# Patient Record
Sex: Male | Born: 1990 | Race: Black or African American | Hispanic: No | Marital: Single | Smoking: Current every day smoker
Health system: Southern US, Community
[De-identification: ages and names within clinical notes are randomized; demographics above are authoritative.]

## PROBLEM LIST (undated history)

## (undated) DIAGNOSIS — N200 Calculus of kidney: Secondary | ICD-10-CM

## (undated) DIAGNOSIS — T148XXA Other injury of unspecified body region, initial encounter: Secondary | ICD-10-CM

## (undated) DIAGNOSIS — J45909 Unspecified asthma, uncomplicated: Secondary | ICD-10-CM

## (undated) HISTORY — PX: KIDNEY STONE SURGERY: SHX686

---

## 2008-06-06 ENCOUNTER — Emergency Department (HOSPITAL_BASED_OUTPATIENT_CLINIC_OR_DEPARTMENT_OTHER): Admission: EM | Admit: 2008-06-06 | Discharge: 2008-06-06 | Payer: Self-pay | Admitting: Emergency Medicine

## 2010-05-10 ENCOUNTER — Emergency Department (HOSPITAL_BASED_OUTPATIENT_CLINIC_OR_DEPARTMENT_OTHER): Admission: EM | Admit: 2010-05-10 | Discharge: 2010-05-10 | Payer: Self-pay | Admitting: Emergency Medicine

## 2010-05-10 ENCOUNTER — Ambulatory Visit: Payer: Self-pay | Admitting: Diagnostic Radiology

## 2010-05-11 ENCOUNTER — Emergency Department (HOSPITAL_BASED_OUTPATIENT_CLINIC_OR_DEPARTMENT_OTHER): Admission: EM | Admit: 2010-05-11 | Discharge: 2010-05-11 | Payer: Self-pay | Admitting: Emergency Medicine

## 2010-09-20 ENCOUNTER — Emergency Department (HOSPITAL_BASED_OUTPATIENT_CLINIC_OR_DEPARTMENT_OTHER): Admission: EM | Admit: 2010-09-20 | Discharge: 2010-09-20 | Payer: Self-pay | Admitting: Emergency Medicine

## 2010-12-27 HISTORY — PX: URETERAL STENT PLACEMENT: SHX822

## 2011-03-15 LAB — URINALYSIS, ROUTINE W REFLEX MICROSCOPIC
Bilirubin Urine: NEGATIVE
Glucose, UA: NEGATIVE mg/dL
Ketones, ur: 15 mg/dL — AB
pH: 6.5 (ref 5.0–8.0)

## 2011-03-15 LAB — URINE MICROSCOPIC-ADD ON

## 2011-03-15 LAB — CBC
HCT: 41.4 % (ref 39.0–52.0)
MCHC: 34.1 g/dL (ref 30.0–36.0)
MCV: 88.9 fL (ref 78.0–100.0)
MCV: 90.6 fL (ref 78.0–100.0)
RBC: 3.75 MIL/uL — ABNORMAL LOW (ref 4.22–5.81)
RBC: 4.65 MIL/uL (ref 4.22–5.81)

## 2011-03-15 LAB — BASIC METABOLIC PANEL
BUN: 16 mg/dL (ref 6–23)
CO2: 28 mEq/L (ref 19–32)
Calcium: 8.5 mg/dL (ref 8.4–10.5)
Calcium: 9.6 mg/dL (ref 8.4–10.5)
GFR calc Af Amer: >60 mL/min (ref >60)
GFR calc Af Amer: >60 mL/min (ref >60)
Glucose, Bld: 98 mg/dL (ref 70–99)
Potassium: 3.5 mEq/L (ref 3.5–5.1)
Potassium: 4.3 mEq/L (ref 3.5–5.1)
Sodium: 144 mEq/L (ref 135–145)
Sodium: 146 mEq/L — ABNORMAL HIGH (ref 135–145)

## 2011-03-15 LAB — DIFFERENTIAL
Basophils Absolute: 0 10*3/uL (ref 0.0–0.1)
Basophils Relative: 0 % (ref 0–1)
Basophils Relative: 1 % (ref 0–1)
Eosinophils Absolute: 0 10*3/uL (ref 0.0–0.7)
Eosinophils Relative: 0 % (ref 0–5)
Lymphs Abs: 1.7 10*3/uL (ref 0.7–4.0)
Monocytes Relative: 5 % (ref 3–12)
Neutro Abs: 8 10*3/uL — ABNORMAL HIGH (ref 1.7–7.7)
Neutrophils Relative %: 84 % — ABNORMAL HIGH (ref 43–77)

## 2011-05-26 ENCOUNTER — Emergency Department (HOSPITAL_BASED_OUTPATIENT_CLINIC_OR_DEPARTMENT_OTHER)
Admission: EM | Admit: 2011-05-26 | Discharge: 2011-05-26 | Disposition: A | Discharge status: Home / Self Care | Payer: No Typology Code available for payment source | Attending: Emergency Medicine | Admitting: Emergency Medicine

## 2011-05-26 DIAGNOSIS — J45909 Unspecified asthma, uncomplicated: Secondary | ICD-10-CM | POA: Insufficient documentation

## 2011-05-26 DIAGNOSIS — Z043 Encounter for examination and observation following other accident: Secondary | ICD-10-CM | POA: Insufficient documentation

## 2011-12-29 ENCOUNTER — Encounter: Payer: Self-pay | Admitting: *Deleted

## 2011-12-29 ENCOUNTER — Emergency Department (HOSPITAL_BASED_OUTPATIENT_CLINIC_OR_DEPARTMENT_OTHER): Payer: Self-pay

## 2011-12-29 ENCOUNTER — Emergency Department (INDEPENDENT_AMBULATORY_CARE_PROVIDER_SITE_OTHER): Payer: Self-pay

## 2011-12-29 ENCOUNTER — Emergency Department (HOSPITAL_BASED_OUTPATIENT_CLINIC_OR_DEPARTMENT_OTHER)
Admission: EM | Admit: 2011-12-29 | Discharge: 2011-12-29 | Disposition: A | Discharge status: Home / Self Care | Payer: Self-pay | Attending: Emergency Medicine | Admitting: Emergency Medicine

## 2011-12-29 DIAGNOSIS — K5900 Constipation, unspecified: Secondary | ICD-10-CM

## 2011-12-29 DIAGNOSIS — F17200 Nicotine dependence, unspecified, uncomplicated: Secondary | ICD-10-CM | POA: Insufficient documentation

## 2011-12-29 DIAGNOSIS — N200 Calculus of kidney: Secondary | ICD-10-CM

## 2011-12-29 DIAGNOSIS — K089 Disorder of teeth and supporting structures, unspecified: Secondary | ICD-10-CM | POA: Insufficient documentation

## 2011-12-29 DIAGNOSIS — R1110 Vomiting, unspecified: Secondary | ICD-10-CM | POA: Insufficient documentation

## 2011-12-29 DIAGNOSIS — R1031 Right lower quadrant pain: Secondary | ICD-10-CM

## 2011-12-29 HISTORY — DX: Calculus of kidney: N20.0

## 2011-12-29 LAB — CBC
Platelets: 170 10*3/uL (ref 150–400)
RBC: 4.52 MIL/uL (ref 4.22–5.81)
RDW: 12.1 % (ref 11.5–15.5)

## 2011-12-29 LAB — URINALYSIS, ROUTINE W REFLEX MICROSCOPIC
Glucose, UA: NEGATIVE mg/dL
Ketones, ur: 15 mg/dL — AB
Nitrite: NEGATIVE
Specific Gravity, Urine: 1.029 (ref 1.005–1.030)
Urobilinogen, UA: 1 mg/dL (ref 0.0–1.0)
pH: 6 (ref 5.0–8.0)

## 2011-12-29 LAB — URINE MICROSCOPIC-ADD ON

## 2011-12-29 LAB — COMPREHENSIVE METABOLIC PANEL
AST: 22 U/L (ref 0–37)
Calcium: 9.3 mg/dL (ref 8.4–10.5)
Chloride: 98 mEq/L (ref 96–112)
Creatinine, Ser: 1.1 mg/dL (ref 0.50–1.35)
GFR calc non Af Amer: >90 mL/min (ref >90)
Potassium: 3.7 mEq/L (ref 3.5–5.1)
Total Bilirubin: 0.6 mg/dL (ref 0.3–1.2)
Total Protein: 7.8 g/dL (ref 6.0–8.3)

## 2011-12-29 LAB — DIFFERENTIAL
Basophils Absolute: 0 10*3/uL (ref 0.0–0.1)
Basophils Relative: 0 % (ref 0–1)
Eosinophils Absolute: 0 10*3/uL (ref 0.0–0.7)
Eosinophils Relative: 0 % (ref 0–5)
Lymphocytes Relative: 19 % (ref 12–46)
Lymphs Abs: 1.8 10*3/uL (ref 0.7–4.0)
Monocytes Relative: 13 % — ABNORMAL HIGH (ref 3–12)

## 2011-12-29 MED ORDER — IOHEXOL 300 MG/ML  SOLN
100.0000 mL | Freq: Once | INTRAMUSCULAR | Status: AC | PRN
  Administered 2011-12-29: 100 mL via INTRAVENOUS

## 2011-12-29 MED ORDER — HYDROMORPHONE HCL PF 1 MG/ML IJ SOLN
1.0000 mg | Freq: Once | INTRAMUSCULAR | Status: AC
  Administered 2011-12-29: 1 mg via INTRAVENOUS
  Filled 2011-12-29: qty 1

## 2011-12-29 MED ORDER — ONDANSETRON HCL 4 MG/2ML IJ SOLN
4.0000 mg | Freq: Once | INTRAMUSCULAR | Status: AC
  Administered 2011-12-29: 4 mg via INTRAVENOUS
  Filled 2011-12-29: qty 2

## 2011-12-29 MED ORDER — METOCLOPRAMIDE HCL 10 MG PO TABS: 10.0000 mg | ORAL_TABLET | Freq: Four times a day (QID) | ORAL | Status: AC | PRN

## 2011-12-29 MED ORDER — SODIUM CHLORIDE 0.9 % IV BOLUS (SEPSIS)
1000.0000 mL | Freq: Once | INTRAVENOUS | Status: AC
  Administered 2011-12-29: 1000 mL via INTRAVENOUS

## 2011-12-29 MED ORDER — SODIUM CHLORIDE 0.9 % IV SOLN
Freq: Once | INTRAVENOUS | Status: AC
  Administered 2011-12-29: 20:00:00 via INTRAVENOUS

## 2011-12-29 MED ORDER — IOHEXOL 300 MG/ML  SOLN
18.0000 mL | INTRAMUSCULAR | Status: DC | PRN
  Administered 2011-12-29: 18 mL via ORAL

## 2011-12-29 MED ORDER — OXYCODONE-ACETAMINOPHEN 5-325 MG PO TABS: 1.0000 | ORAL_TABLET | ORAL | Status: AC | PRN

## 2011-12-29 NOTE — ED Notes (Signed)
Pt ambulatory to restroom

## 2011-12-29 NOTE — ED Provider Notes (Signed)
History     CSN: [id number]  Arrival date & time 12/29/11  1629   First MD Initiated Contact with Patient 12/29/11 1722      Chief Complaint  Patient presents with  . Flank Pain  . Emesis  . Dental Pain    (Consider location/radiation/quality/duration/timing/severity/associated sxs/prior treatment) Patient is a 21 y.o. male presenting with flank pain, vomiting, and tooth pain. The history is provided by the patient.  Flank Pain  Emesis   Dental Pain He complains of a two-day history of fever to 102, chills, right flank pain, and cough along with generalized myalgias. He also broke a right lower second molar tooth and has pain in that tooth radiating up to his jaw and toward his ear. There has been associated nausea, vomiting. He denies diarrhea and denies and dysuria. He does have a history of kidney stones and states that the flank pain is similar to his kidney stone pain. Pain is severe and he rates it at 7 out of 10. He has not taken anything for pain or for fever. There has been some dizziness. He denies any sick contacts.  Past Medical History  Diagnosis Date  . Kidney stones     History reviewed. No pertinent past surgical history.  History reviewed. No pertinent family history.  History  Substance Use Topics  . Smoking status: Current Some Day Smoker  . Smokeless tobacco: Never Used  . Alcohol Use: No      Review of Systems  Gastrointestinal: Positive for vomiting.  Genitourinary: Positive for flank pain.  All other systems reviewed and are negative.    Allergies  Review of patient's allergies indicates no known allergies.  Home Medications   Current Outpatient Rx  Name Route Sig Dispense Refill  . PSEUDOEPH-DOXYLAMINE-DM-APAP 60-7.05-25-999 MG/30ML PO LIQD Oral Take 30 mLs by mouth every 6 (six) hours as needed. For congestion     . ALBUTEROL SULFATE HFA 108 (90 BASE) MCG/ACT IN AERS Inhalation Inhale 2 puffs into the lungs every 6 (six) hours as  needed. For shortness of breath and wheezing     . FLUTICASONE-SALMETEROL 250-50 MCG/DOSE IN AEPB Inhalation Inhale 1 puff into the lungs every 12 (twelve) hours.        BP 107/61  Pulse 96  Temp(Src) 97.8 F (36.6 C) (Oral)  Resp 20  SpO2 100%  Physical Exam  Nursing note and vitals reviewed.  21 year old male who appears uncomfortable. Vital signs are normal. Oxygen saturation is 100% which is normal. Head is normocephalic and atraumatic. PERRLA, EOMI. Mucous membranes are slightly dry. Tooth #31 is carious with tenderness in that region. Neck is supple without adenopathy or or JVD. Lungs are clear without any rales, wheezes, rhonchi. Back is nontender there's no CVA tenderness. Heart has regular rate rhythm without murmur. Abdomen is soft, flat, with moderate to severe tenderness in the right lower quadrant in McBurney's area. There is no rebound or guarding, but there is tenderness with percussion and with coughing. Peristalsis is diminished. Extremities have no cyanosis or edema, full range of motion present. Skin is warm and dry without rash. Neurologic: Mental status is normal, cranial nerves are intact, there no focal motor or sensory deficits. Psychiatric: No abnormalities of mood or affect.  ED Course  Procedures (including critical care time)  Labs Reviewed  URINALYSIS, ROUTINE W REFLEX MICROSCOPIC - Abnormal; Notable for the following:    Color, Urine AMBER (*) BIOCHEMICALS MAY BE AFFECTED BY COLOR   Hgb urine dipstick TRACE (*)  Ketones, ur 15 (*)    Leukocytes, UA SMALL (*)    All other components within normal limits  URINE MICROSCOPIC-ADD ON   No results found.  Results for orders placed during the hospital encounter of 12/29/11  URINALYSIS, ROUTINE W REFLEX MICROSCOPIC      Component Value Range   Color, Urine AMBER (*) YELLOW    APPearance CLEAR  CLEAR    Specific Gravity, Urine 1.029  1.005 - 1.030    pH 6.0  5.0 - 8.0    Glucose, UA NEGATIVE  NEGATIVE (mg/dL)    Hgb urine dipstick TRACE (*) NEGATIVE    Bilirubin Urine NEGATIVE  NEGATIVE    Ketones, ur 15 (*) NEGATIVE (mg/dL)   Protein, ur NEGATIVE  NEGATIVE (mg/dL)   Urobilinogen, UA 1.0  0.0 - 1.0 (mg/dL)   Nitrite NEGATIVE  NEGATIVE    Leukocytes, UA SMALL (*) NEGATIVE   URINE MICROSCOPIC-ADD ON      Component Value Range   Squamous Epithelial / LPF RARE  RARE    WBC, UA 3-6  <3 (WBC/hpf)   RBC / HPF 0-2  <3 (RBC/hpf)   Bacteria, UA RARE  RARE    Urine-Other MUCOUS PRESENT    CBC      Component Value Range   WBC 9.4  4.0 - 10.5 (K/uL)   RBC 4.52  4.22 - 5.81 (MIL/uL)   Hemoglobin 13.3  13.0 - 17.0 (g/dL)   HCT 21.3 (*) 08.6 - 52.0 (%)   MCV 85.8  78.0 - 100.0 (fL)   MCH 29.4  26.0 - 34.0 (pg)   MCHC 34.3  30.0 - 36.0 (g/dL)   RDW 57.8  46.9 - 62.9 (%)   Platelets 170  150 - 400 (K/uL)  DIFFERENTIAL      Component Value Range   Neutrophils Relative 68  43 - 77 (%)   Neutro Abs 6.5  1.7 - 7.7 (K/uL)   Lymphocytes Relative 19  12 - 46 (%)   Lymphs Abs 1.8  0.7 - 4.0 (K/uL)   Monocytes Relative 13 (*) 3 - 12 (%)   Monocytes Absolute 1.2 (*) 0.1 - 1.0 (K/uL)   Eosinophils Relative 0  0 - 5 (%)   Eosinophils Absolute 0.0  0.0 - 0.7 (K/uL)   Basophils Relative 0  0 - 1 (%)   Basophils Absolute 0.0  0.0 - 0.1 (K/uL)  COMPREHENSIVE METABOLIC PANEL      Component Value Range   Sodium 136  135 - 145 (mEq/L)   Potassium 3.7  3.5 - 5.1 (mEq/L)   Chloride 98  96 - 112 (mEq/L)   CO2 27  19 - 32 (mEq/L)   Glucose, Bld 96  70 - 99 (mg/dL)   BUN 14  6 - 23 (mg/dL)   Creatinine, Ser 5.28  0.50 - 1.35 (mg/dL)   Calcium 9.3  8.4 - 41.3 (mg/dL)   Total Protein 7.8  6.0 - 8.3 (g/dL)   Albumin 4.5  3.5 - 5.2 (g/dL)   AST 22  0 - 37 (U/L)   ALT 14  0 - 53 (U/L)   Alkaline Phosphatase 68  39 - 117 (U/L)   Total Bilirubin 0.6  0.3 - 1.2 (mg/dL)   GFR calc non Af Amer >90  >90 (mL/min)   GFR calc Af Amer >90  >90 (mL/min)  LIPASE, BLOOD      Component Value Range   Lipase 45  11 - 59 (U/L)     Ct  Abdomen Pelvis W Contrast  12/29/2011  **ADDENDUM** CREATED: 12/29/2011 20:55:31  2 hours after the initial examination, the patient was returned for delayed imaging through the pelvis.  No additional contrast was administered.  The primary restraint of the examination remains (limited intra-abdominal fat).  The small bowel and right colon are now well filled with contrast.  There is a tubular air-filled structure along the right pelvic sidewall (axial images 22 - 27 and coronal image 37) which is most consistent with a normal appendix. No inflammatory changes are identified. Contrast material is present within the ureters bilaterally.  There is no evidence of ureteral obstruction.  In summary, delayed images through the pelvis do not show any evidence of appendicitis.  **END ADDENDUM** SIGNED BY: Gerrianne Scale, M.D.    12/29/2011  *RADIOLOGY REPORT*  Clinical Data: Right lower quadrant abdominal pain with constipation and vomiting.  Evaluate for appendicitis or kidney stones.  History of asthma and kidney stones.  CT ABDOMEN AND PELVIS WITH CONTRAST  Technique:  Multidetector CT imaging of the abdomen and pelvis was performed following the standard protocol during bolus administration of intravenous contrast.  Contrast: OMNIPAQUE IOHEXOL 300 MG/ML IV SOLN  Comparison: CT urogram 05/10/2010.  Findings: The lung bases are clear.  There is no pleural effusion. Previously demonstrated right renal calculi are not as well seen on this postcontrast examination but appear grossly unchanged.  The left kidney appears normal.  There is no hydronephrosis.  Ureteral assessment is limited by the paucity of intra-abdominal fat.  I do not see any evidence of a ureteral or bladder calculus.  The liver, spleen, gallbladder, pancreas and adrenal glands appear unremarkable.  Enteric contrast has emptied from the stomach into the proximal to mid small bowel.  The colon is not yet opacified with contrast. The cecum is  located inferiorly in the right lower quadrant adjacent to the bladder.  The appendix is not identified with certainty.  However, there is an oval focus of prominent mucosal enhancement superior to the urinary bladder on image 72.  This could reflect distal small bowel or the appendix.  No definite extraluminal fluid collections or focal inflammatory changes are identified.  However, assessment for pelvic inflammation is limited by the lack of pelvic fat.  The bladder appears unremarkable.  No acute osseous findings are demonstrated.  IMPRESSION:  1.  Limited study due to non opacification of the terminal ileum and cecum and limited intra-abdominal fat.  The appendix is not seen with certainty. 2.  Right renal calculi appear grossly unchanged.  There is no evidence of ureteral calculus or hydronephrosis.  Close clinical follow-up is recommended due to the limitations of this examination.  Findings were discussed by telephone with Dr. Preston Fleeting at 1910 hours on 12/29/2011.  Original Report Authenticated By: Gerrianne Scale, M.D.     No diagnosis found.  He is given IV hydration, and Dilaudid with Zofran for pain. This is given disability for pain but is required re\re medication. I reviewed the CT scan and discussed with radiologist. He appendix is not visualized, but he has no body fat to show signs of inflammatory changes, and oral contrast had not reached the cecum. I discussed case with Dr. Derrell Lolling of [facility] surgery and we will repeat the CT scan after several hours to see if contrast has reached the cecum to allow definitive evaluation of the appendix. If not, patient will be sent to [facility] emergency department for a consultation with surgery.  2125: CT scan seems to show at this point a normal appendix, but it is still suboptimal because of a paucity of fat. Patient was reexamined, and he continues to have tenderness in the right lower quadrant, but it does not seem to be as severe  as it was initially. With normal WBC and differential indicating probable viral illness, I feel he most likely has mesenteric adenitis. He will be discharged, but is advised that he should return if symptoms worsen or just are not improving. It was impressed upon him and upon family that is with him that appendicitis can sometimes be difficult to diagnose initially.  MDM  Symptom complex which may all be influenza, but with well localized right lower quadrant pain, need to get a CT scan to rule out appendicitis. Of note, when he is asked to point to where his flank pain is, he actually points to the lateral aspect of the right lower quadrant and not to the back or flank. Dental caries and/or fracture will need to be treated by a dentist.        Dione Booze, MD 12/29/11 2125

## 2011-12-29 NOTE — ED Notes (Signed)
Pt transferred to ED room 9.  Visitors at bedside. Awaiting CT scan.

## 2011-12-29 NOTE — ED Notes (Signed)
Vomiting since new years eve dizzy flank pain and tooth ache pt drinking gatorade in triage

## 2012-06-27 ENCOUNTER — Emergency Department (HOSPITAL_BASED_OUTPATIENT_CLINIC_OR_DEPARTMENT_OTHER)
Admission: EM | Admit: 2012-06-27 | Discharge: 2012-06-27 | Disposition: A | Discharge status: Home / Self Care | Payer: Federal, State, Local not specified - PPO | Attending: Emergency Medicine | Admitting: Emergency Medicine

## 2012-06-27 ENCOUNTER — Encounter (HOSPITAL_BASED_OUTPATIENT_CLINIC_OR_DEPARTMENT_OTHER): Payer: Self-pay | Admitting: *Deleted

## 2012-06-27 DIAGNOSIS — T6391XA Toxic effect of contact with unspecified venomous animal, accidental (unintentional), initial encounter: Secondary | ICD-10-CM | POA: Insufficient documentation

## 2012-06-27 DIAGNOSIS — T63441A Toxic effect of venom of bees, accidental (unintentional), initial encounter: Secondary | ICD-10-CM

## 2012-06-27 DIAGNOSIS — T63461A Toxic effect of venom of wasps, accidental (unintentional), initial encounter: Secondary | ICD-10-CM | POA: Insufficient documentation

## 2012-06-27 HISTORY — DX: Unspecified asthma, uncomplicated: J45.909

## 2012-06-27 MED ORDER — DIPHENHYDRAMINE HCL 25 MG PO CAPS
25.0000 mg | ORAL_CAPSULE | Freq: Once | ORAL | Status: AC
  Administered 2012-06-27: 25 mg via ORAL
  Filled 2012-06-27: qty 1

## 2012-06-27 MED ORDER — PREDNISONE 20 MG PO TABS: ORAL_TABLET | ORAL | Status: AC

## 2012-06-27 MED ORDER — PREDNISONE 50 MG PO TABS
60.0000 mg | ORAL_TABLET | Freq: Once | ORAL | Status: AC
  Administered 2012-06-27: 60 mg via ORAL
  Filled 2012-06-27: qty 1

## 2012-06-27 MED ORDER — FAMOTIDINE 20 MG PO TABS
20.0000 mg | ORAL_TABLET | Freq: Once | ORAL | Status: AC
  Administered 2012-06-27: 20 mg via ORAL
  Filled 2012-06-27: qty 1

## 2012-06-27 NOTE — ED Notes (Signed)
Pt was stung by a yellow jacket to the right lower leg 2 days ago. Now experiencing pain and swelling that is worsening and extending into the foot.

## 2012-06-27 NOTE — ED Provider Notes (Signed)
History     CSN: [id number]  Arrival date & time 06/27/12  0036   First MD Initiated Contact with Patient 06/27/12 [phone]      Chief Complaint  Patient presents with  . Leg Swelling    (Consider location/radiation/quality/duration/timing/severity/associated sxs/prior treatment) Patient is a 21 y.o. male presenting with rash. The history is provided by the patient. No language interpreter was used.  Rash  This is a new problem. The current episode started 2 days ago. The problem has been gradually worsening. The problem is associated with an insect bite/sting (yellow jacket). There has been no fever. The rash is present on the right lower leg. The patient is experiencing no pain. Associated symptoms include itching. He has tried nothing for the symptoms. The treatment provided no relief. Risk factors: yellow jacket sting.    Past Medical History  Diagnosis Date  . Kidney stones   . Asthma     History reviewed. No pertinent past surgical history.  No family history on file.  History  Substance Use Topics  . Smoking status: Current Some Day Smoker  . Smokeless tobacco: Never Used  . Alcohol Use: No      Review of Systems  Skin: Positive for itching and rash. Negative for wound.  All other systems reviewed and are negative.    Allergies  Review of patient's allergies indicates no known allergies.  Home Medications   Current Outpatient Rx  Name Route Sig Dispense Refill  . ALBUTEROL SULFATE HFA 108 (90 BASE) MCG/ACT IN AERS Inhalation Inhale 2 puffs into the lungs every 6 (six) hours as needed. For shortness of breath and wheezing     . FLUTICASONE-SALMETEROL 250-50 MCG/DOSE IN AEPB Inhalation Inhale 1 puff into the lungs every 12 (twelve) hours.      Marland Kitchen PSEUDOEPH-DOXYLAMINE-DM-APAP 60-7.05-25-999 MG/30ML PO LIQD Oral Take 30 mLs by mouth every 6 (six) hours as needed. For congestion       BP 98/55  Pulse 65  Temp 98 F (36.7 C) (Oral)  Resp 18  Ht 5\' 11"  (1.803  m)  Wt 148 lb (67.132 kg)  BMI 20.64 kg/m2  SpO2 100%  Physical Exam  Constitutional: He is oriented to person, place, and time. He appears well-developed and well-nourished. No distress.  HENT:  Head: Normocephalic and atraumatic.  Mouth/Throat: Oropharynx is clear and moist.       No swelling of the lips tongue or uvula  Eyes: Conjunctivae are normal. Pupils are equal, round, and reactive to light.  Neck: Normal range of motion. Neck supple.  Cardiovascular: Normal rate and regular rhythm.   Pulmonary/Chest: Effort normal and breath sounds normal. No stridor. He has no wheezes. He has no rales.  Abdominal: Soft. Bowel sounds are normal. There is no tenderness. There is no rebound and no guarding.  Musculoskeletal: Normal range of motion.       Legs: Neurological: He is alert and oriented to person, place, and time.  Skin: Skin is warm and dry. No pallor.  Psychiatric: He has a normal mood and affect.    ED Course  Procedures (including critical care time)  Labs Reviewed - No data to display No results found.   No diagnosis found.    MDM  Return for hives, swelling of the lips tongue or uvula or shortness of breath.  Patient verbalizes understanding and agrees to follow up        Lorynn Moeser Smitty Cords, MD 06/27/12 1191

## 2012-06-27 NOTE — Discharge Instructions (Signed)
Bee, Wasp, or Hornet Sting   Your caregiver has diagnosed you as having an insect sting. An insect sting appears as a red lump in the skin that sometimes has a tiny hole in the center, or it may have a stinger in the center of the wound. The most common stings are from wasps, hornets and bees.   Individuals have different reactions to insect stings.   A normal reaction may cause pain, swelling, and redness around the sting site.   A localized allergic reaction may cause swelling and redness that extends beyond the sting site.   A large local reaction may continue to develop over the next 12 to 36 hours.   On occasion, the reactions can be severe (anaphylactic reaction). An anaphylactic reaction may cause wheezing; difficulty breathing; chest pain; fainting; raised, itchy, red patches on the skin; a sick feeling to your stomach (nausea); vomiting; cramping; or diarrhea. If you have had an anaphylactic reaction to an insect sting in the past, you are more likely to have one again.   HOME CARE INSTRUCTIONS   With bee stings, a small sac of poison is left in the wound. Brushing across this with something such as a credit card, or anything similar, will help remove this and decrease the amount of the reaction. This same procedure will not help a wasp sting as they do not leave behind a stinger and poison sac.   Apply a cold compress for 10 to 20 minutes every hour for 1 to 2 days, depending on severity, to reduce swelling and itching.   To lessen pain, a paste made of water and baking soda may be rubbed on the bite or sting and left on for 5 minutes.   To relieve itching and swelling, you may use take medication or apply medicated creams or lotions as directed.   Only take over-the-counter or prescription medicines for pain, discomfort, or fever as directed by your caregiver.   Wash the sting site daily with soap and water. Apply antibiotic ointment on the sting site as directed.   If you suffered a severe reaction:   If  you did not require hospitalization, an adult will need to stay with you for 24 hours in case the symptoms return.   You may need to wear a medical bracelet or necklace stating the allergy.   You and your family need to learn when and how to use an anaphylaxis kit or epinephrine injection.   If you have had a severe reaction before, always carry your anaphylaxis kit with you.   SEEK MEDICAL CARE IF:   None of the above helps within 2 to 3 days.   The area becomes red, warm, tender, and swollen beyond the area of the bite or sting.   You have an oral temperature above 102° F (38.9° C).   SEEK IMMEDIATE MEDICAL CARE IF:   You have symptoms of an allergic reaction which are:   Wheezing.   Difficulty breathing.   Chest pain.   Lightheadedness or fainting.   Itchy, raised, red patches on the skin.   Nausea, vomiting, cramping or diarrhea.   ANY OF THESE SYMPTOMS MAY REPRESENT A SERIOUS PROBLEM THAT IS AN EMERGENCY. Do not wait to see if the symptoms will go away. Get medical help right away. Call your local emergency services (911 in [place] DO NOT drive yourself to the hospital.   MAKE SURE YOU:   Understand these instructions.   Will watch your condition.     Will get help right away if you are not doing well or get worse.   Document Released: 12/13/2005 Document Revised: 12/02/2011 Document Reviewed: 05/30/2010   [employer]® Patient Information ©2012 [employer].

## 2012-11-01 ENCOUNTER — Emergency Department (HOSPITAL_BASED_OUTPATIENT_CLINIC_OR_DEPARTMENT_OTHER)
Admission: EM | Admit: 2012-11-01 | Discharge: 2012-11-01 | Disposition: A | Discharge status: Home / Self Care | Payer: Federal, State, Local not specified - PPO | Attending: Emergency Medicine | Admitting: Emergency Medicine

## 2012-11-01 ENCOUNTER — Encounter (HOSPITAL_BASED_OUTPATIENT_CLINIC_OR_DEPARTMENT_OTHER): Payer: Self-pay | Admitting: *Deleted

## 2012-11-01 DIAGNOSIS — Z87442 Personal history of urinary calculi: Secondary | ICD-10-CM | POA: Insufficient documentation

## 2012-11-01 DIAGNOSIS — T6391XA Toxic effect of contact with unspecified venomous animal, accidental (unintentional), initial encounter: Secondary | ICD-10-CM | POA: Insufficient documentation

## 2012-11-01 DIAGNOSIS — T63461A Toxic effect of venom of wasps, accidental (unintentional), initial encounter: Secondary | ICD-10-CM | POA: Insufficient documentation

## 2012-11-01 DIAGNOSIS — Y929 Unspecified place or not applicable: Secondary | ICD-10-CM | POA: Insufficient documentation

## 2012-11-01 DIAGNOSIS — F17200 Nicotine dependence, unspecified, uncomplicated: Secondary | ICD-10-CM | POA: Insufficient documentation

## 2012-11-01 DIAGNOSIS — T7840XA Allergy, unspecified, initial encounter: Secondary | ICD-10-CM

## 2012-11-01 DIAGNOSIS — J45909 Unspecified asthma, uncomplicated: Secondary | ICD-10-CM | POA: Insufficient documentation

## 2012-11-01 DIAGNOSIS — Z79899 Other long term (current) drug therapy: Secondary | ICD-10-CM | POA: Insufficient documentation

## 2012-11-01 DIAGNOSIS — Y9389 Activity, other specified: Secondary | ICD-10-CM | POA: Insufficient documentation

## 2012-11-01 MED ORDER — ACETAMINOPHEN 325 MG PO TABS
650.0000 mg | ORAL_TABLET | Freq: Once | ORAL | Status: AC
  Administered 2012-11-01: 650 mg via ORAL
  Filled 2012-11-01: qty 2

## 2012-11-01 MED ORDER — EPINEPHRINE 0.3 MG/0.3ML IJ DEVI: 0.3000 mg | INTRAMUSCULAR | Status: DC | PRN

## 2012-11-01 NOTE — ED Notes (Signed)
Warm blanket provided and mother at bedside.
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
## 2012-11-01 NOTE — ED Notes (Signed)
Vrinda Pickering, FNP at bedside for evaluation 

## 2012-11-01 NOTE — ED Provider Notes (Signed)
Medical screening examination/treatment/procedure(s) were performed by non-physician practitioner and as supervising physician I was immediately available for consultation/collaboration.  Jenette Rayson, MD 11/01/12 2322 

## 2012-11-01 NOTE — ED Notes (Signed)
Skin appears red and generalized hives and facial swelling noted.  Pt denies difficulty breathing and states hives are improving.  Pt is receiving Albuterol/Atrovent Neb tx per RRT.  IVF infusing and VSS.

## 2012-11-01 NOTE — ED Notes (Signed)
No skin redness or hives noted.  Respirations unlabored.

## 2012-11-01 NOTE — ED Notes (Signed)
Pt reports he was stung by a yellow jacket PTA and developed facial swelling, difficulty swallowing, skin redness, itching and hives within minutes.  Pt took PO Benadryl at home. Periorbital swelling noted.

## 2012-11-01 NOTE — ED Notes (Signed)
Teressa Lower FNP at bedside
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
## 2012-11-03 ENCOUNTER — Encounter (HOSPITAL_BASED_OUTPATIENT_CLINIC_OR_DEPARTMENT_OTHER): Payer: Self-pay | Admitting: *Deleted

## 2012-11-03 ENCOUNTER — Emergency Department (HOSPITAL_BASED_OUTPATIENT_CLINIC_OR_DEPARTMENT_OTHER)
Admission: EM | Admit: 2012-11-03 | Discharge: 2012-11-03 | Disposition: A | Discharge status: Home / Self Care | Payer: Federal, State, Local not specified - PPO | Attending: Emergency Medicine | Admitting: Emergency Medicine

## 2012-11-03 DIAGNOSIS — Y939 Activity, unspecified: Secondary | ICD-10-CM | POA: Insufficient documentation

## 2012-11-03 DIAGNOSIS — T63461A Toxic effect of venom of wasps, accidental (unintentional), initial encounter: Secondary | ICD-10-CM | POA: Insufficient documentation

## 2012-11-03 DIAGNOSIS — J45909 Unspecified asthma, uncomplicated: Secondary | ICD-10-CM | POA: Insufficient documentation

## 2012-11-03 DIAGNOSIS — Z87442 Personal history of urinary calculi: Secondary | ICD-10-CM | POA: Insufficient documentation

## 2012-11-03 DIAGNOSIS — F17200 Nicotine dependence, unspecified, uncomplicated: Secondary | ICD-10-CM | POA: Insufficient documentation

## 2012-11-03 DIAGNOSIS — Y929 Unspecified place or not applicable: Secondary | ICD-10-CM | POA: Insufficient documentation

## 2012-11-03 DIAGNOSIS — T6391XA Toxic effect of contact with unspecified venomous animal, accidental (unintentional), initial encounter: Secondary | ICD-10-CM | POA: Insufficient documentation

## 2012-11-03 DIAGNOSIS — Z79899 Other long term (current) drug therapy: Secondary | ICD-10-CM | POA: Insufficient documentation

## 2012-11-03 DIAGNOSIS — T63481A Toxic effect of venom of other arthropod, accidental (unintentional), initial encounter: Secondary | ICD-10-CM

## 2012-11-03 NOTE — ED Provider Notes (Signed)
History     CSN: [id number]  Arrival date & time 11/03/12  1012   First MD Initiated Contact with Patient 11/03/12 1159      Chief Complaint  Patient presents with  . Eye Problem    (Consider location/radiation/quality/duration/timing/severity/associated sxs/prior treatment) HPI Pt presents with c/o area of redness and itching underneath right eye.  He was stung by a bee 2 days ago in this area.  No changes in vision, no drainage from eye. He states after leaving the ED the itching and swelling had gone down, but then recurred. He has not been taking anything for his symptoms at home.  No lip/tongue swelling, no difficulty breathing.  No hives or diffuse rash.  There are no other associated systemic symptoms, there are no other alleviating or modifying factors.   Past Medical History  Diagnosis Date  . Kidney stones   . Asthma     History reviewed. No pertinent past surgical history.  History reviewed. No pertinent family history.  History  Substance Use Topics  . Smoking status: Current Some Day Smoker  . Smokeless tobacco: Never Used  . Alcohol Use: No      Review of Systems ROS reviewed and all otherwise negative except for mentioned in HPI  Allergies  Bee venom  Home Medications   Current Outpatient Rx  Name  Route  Sig  Dispense  Refill  . ALBUTEROL SULFATE HFA 108 (90 BASE) MCG/ACT IN AERS   Inhalation   Inhale 2 puffs into the lungs every 6 (six) hours as needed. For shortness of breath and wheezing          . EPINEPHRINE 0.3 MG/0.3ML IJ DEVI   Intramuscular   Inject 0.3 mLs (0.3 mg total) into the muscle as needed.   1 Device   2   . FLUTICASONE-SALMETEROL 250-50 MCG/DOSE IN AEPB   Inhalation   Inhale 1 puff into the lungs every 12 (twelve) hours.           Marland Kitchen PSEUDOEPH-DOXYLAMINE-DM-APAP 60-7.05-25-999 MG/30ML PO LIQD   Oral   Take 30 mLs by mouth every 6 (six) hours as needed. For congestion            BP 99/55  Pulse 80  Temp 97.6  F (36.4 C) (Oral)  Resp 14  SpO2 100% Vitals reviewed Physical Exam Physical Examination: General appearance - alert, well appearing, and in no distress Mental status - alert, oriented to person, place, and time Eyes - pupils equal and reactive, extraocular eye movements intact, no conjunctival injection, mild edema of lower right eyelid, no drainage or proptosis Mouth - mucous membranes moist, pharynx normal without lesions Chest - clear to auscultation, no wheezes, rales or rhonchi, symmetric air entry Heart - normal rate, regular rhythm, normal S1, S2, no murmurs, rubs, clicks or gallops Extremities - peripheral pulses normal, no pedal edema, no clubbing or cyanosis Skin - normal turgor, area of erythema and edema inferior to right eye, mild swelling of lower eyelid, no other rashes  ED Course  Procedures (including critical care time)  Labs Reviewed - No data to display No results found.   1. Local reaction to insect sting       MDM  Pt presenting with swelling and itching below right eye in the area that he was stung by a bee 2 days ago.  He felt improved after meds in ED, but has not taken anything since leaving.  No signs of cellulitis or eye involvement.  Advised  patient to use cold compresses and take benadryl q6 hours for swelling and itching.  Discharged with strict return precautions.  Pt agreeable with plan.        Ethelda Chick, MD 11/03/12 1245
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
## 2013-04-26 ENCOUNTER — Emergency Department (HOSPITAL_BASED_OUTPATIENT_CLINIC_OR_DEPARTMENT_OTHER)
Admission: EM | Admit: 2013-04-26 | Discharge: 2013-04-26 | Disposition: A | Discharge status: Home / Self Care | Payer: Federal, State, Local not specified - PPO | Attending: Emergency Medicine | Admitting: Emergency Medicine

## 2013-04-26 ENCOUNTER — Emergency Department (HOSPITAL_BASED_OUTPATIENT_CLINIC_OR_DEPARTMENT_OTHER): Payer: Federal, State, Local not specified - PPO

## 2013-04-26 ENCOUNTER — Encounter (HOSPITAL_BASED_OUTPATIENT_CLINIC_OR_DEPARTMENT_OTHER): Payer: Self-pay | Admitting: *Deleted

## 2013-04-26 DIAGNOSIS — IMO0002 Reserved for concepts with insufficient information to code with codable children: Secondary | ICD-10-CM | POA: Insufficient documentation

## 2013-04-26 DIAGNOSIS — Z87442 Personal history of urinary calculi: Secondary | ICD-10-CM | POA: Insufficient documentation

## 2013-04-26 DIAGNOSIS — J45909 Unspecified asthma, uncomplicated: Secondary | ICD-10-CM | POA: Insufficient documentation

## 2013-04-26 DIAGNOSIS — Z79899 Other long term (current) drug therapy: Secondary | ICD-10-CM | POA: Insufficient documentation

## 2013-04-26 DIAGNOSIS — F17200 Nicotine dependence, unspecified, uncomplicated: Secondary | ICD-10-CM | POA: Insufficient documentation

## 2013-04-26 DIAGNOSIS — N23 Unspecified renal colic: Secondary | ICD-10-CM | POA: Insufficient documentation

## 2013-04-26 LAB — URINALYSIS, ROUTINE W REFLEX MICROSCOPIC
Glucose, UA: NEGATIVE mg/dL
Ketones, ur: 15 mg/dL — AB
pH: 6 (ref 5.0–8.0)

## 2013-04-26 LAB — URINE MICROSCOPIC-ADD ON

## 2013-04-26 MED ORDER — HYDROMORPHONE HCL PF 1 MG/ML IJ SOLN
1.0000 mg | Freq: Once | INTRAMUSCULAR | Status: AC
  Administered 2013-04-26: 1 mg via INTRAVENOUS
  Filled 2013-04-26: qty 1

## 2013-04-26 MED ORDER — SODIUM CHLORIDE 0.9 % IV SOLN
Freq: Once | INTRAVENOUS | Status: AC
  Administered 2013-04-26: 1000 mL via INTRAVENOUS

## 2013-04-26 MED ORDER — KETOROLAC TROMETHAMINE 15 MG/ML IJ SOLN
15.0000 mg | Freq: Once | INTRAMUSCULAR | Status: AC
  Administered 2013-04-26: 15 mg via INTRAVENOUS
  Filled 2013-04-26: qty 1

## 2013-04-26 NOTE — ED Notes (Signed)
MD at bedside. 

## 2013-04-26 NOTE — ED Notes (Signed)
C/o right flank pain since Saturday with n/v. Pt denies fever. Has had kidney stones in past. Pt took ODT Zofran at 10pm.

## 2013-04-26 NOTE — ED Notes (Signed)
Pt discharged home with companion.  Instructed to follow up with urologist.

## 2013-04-26 NOTE — ED Provider Notes (Signed)
History     CSN: [id number]  Arrival date & time 04/26/13  0104   First MD Initiated Contact with Patient 04/26/13 0144      Chief Complaint  Patient presents with  . Flank Pain    (Consider location/radiation/quality/duration/timing/severity/associated sxs/prior treatment) HPI This is a 22 year old male with a history of one prior kidney stone. He is here with right flank pain that began 5 days ago. His pain worsened this morning which is why he finally sought medical care. He describes the pain as moderate to severe and characterized as like his prior kidney stone. The pain became so severe this morning it made him vomit. He has not noticed any hematuria. He states the pain is somewhat worsened when he is standing. He was given Zofran ODT in triage with improvement in his nausea.  Past Medical History  Diagnosis Date  . Kidney stones   . Asthma     Past Surgical History  Procedure Laterality Date  . Lithotripsy      History reviewed. No pertinent family history.  History  Substance Use Topics  . Smoking status: Current Every Day Smoker  . Smokeless tobacco: Never Used  . Alcohol Use: No      Review of Systems  All other systems reviewed and are negative.    Allergies  Bee venom  Home Medications   Current Outpatient Rx  Name  Route  Sig  Dispense  Refill  . albuterol (PROVENTIL HFA;VENTOLIN HFA) 108 (90 BASE) MCG/ACT inhaler   Inhalation   Inhale 2 puffs into the lungs every 6 (six) hours as needed. For shortness of breath and wheezing          . EPINEPHrine (EPIPEN) 0.3 mg/0.3 mL DEVI   Intramuscular   Inject 0.3 mLs (0.3 mg total) into the muscle as needed.   1 Device   2   . Fluticasone-Salmeterol (ADVAIR) 250-50 MCG/DOSE AEPB   Inhalation   Inhale 1 puff into the lungs every 12 (twelve) hours.           . Pseudoeph-Doxylamine-DM-APAP (NYQUIL) 60-7.05-25-999 MG/30ML LIQD   Oral   Take 30 mLs by mouth every 6 (six) hours as needed. For  congestion            BP 98/59  Pulse 82  Temp(Src) 97.9 F (36.6 C) (Oral)  Resp 18  Ht 5\' 10"  (1.778 m)  Wt 165 lb (74.844 kg)  BMI 23.68 kg/m2  SpO2 96%  Physical Exam General: Well-developed, well-nourished male in no acute distress; appearance consistent with age of record HENT: normocephalic, atraumatic Eyes: pupils equal round and reactive to light; extraocular muscles intact Neck: supple Heart: regular rate and rhythm Lungs: clear to auscultation bilaterally Abdomen: soft; nondistended; nontender; no masses or hepatosplenomegaly; bowel sounds present GU: mild right CVA tenderness Extremities: No deformity; full range of motion Neurologic: Awake, alert; motor function intact in all extremities and symmetric; no facial droop Skin: Warm and dry Psychiatric: Flat affect    ED Course  Procedures (including critical care time)     MDM   Nursing notes and vitals signs, including pulse oximetry, reviewed.  Summary of this visit's results, reviewed by myself:  Labs:  Results for orders placed during the hospital encounter of 04/26/13 (from the past 24 hour(s))  URINALYSIS, ROUTINE W REFLEX MICROSCOPIC     Status: Abnormal   Collection Time    04/26/13  1:09 AM      Result Value Range   Color, Urine  YELLOW  YELLOW   APPearance CLEAR  CLEAR   Specific Gravity, Urine 1.028  1.005 - 1.030   pH 6.0  5.0 - 8.0   Glucose, UA NEGATIVE  NEGATIVE mg/dL   Hgb urine dipstick MODERATE (*) NEGATIVE   Bilirubin Urine NEGATIVE  NEGATIVE   Ketones, ur 15 (*) NEGATIVE mg/dL   Protein, ur NEGATIVE  NEGATIVE mg/dL   Urobilinogen, UA 0.2  0.0 - 1.0 mg/dL   Nitrite NEGATIVE  NEGATIVE   Leukocytes, UA SMALL (*) NEGATIVE  URINE MICROSCOPIC-ADD ON     Status: None   Collection Time    04/26/13  1:09 AM      Result Value Range   Squamous Epithelial / LPF RARE  RARE   WBC, UA 3-6  <3 WBC/hpf   RBC / HPF 7-10  <3 RBC/hpf   Urine-Other MUCOUS PRESENT      Imaging  Studies: Ct Abdomen Pelvis Wo Contrast  04/26/2013  *RADIOLOGY REPORT*  Clinical Data: Right flank pain.  Previous history of renal stones.  CT ABDOMEN AND PELVIS WITHOUT CONTRAST  Technique:  Multidetector CT imaging of the abdomen and pelvis was performed following the standard protocol without intravenous contrast.  Comparison: 12/29/2011  Findings: The lung bases are clear.  There is somewhat amorphous increased density suggesting calcifications in the medullary pyramids.  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  Or discrete stone in the right kidney upper pole measuring about 5 mm diameter.  No pyelocaliectasis or ureterectasis on either side.  No ureteral stones are demonstrated.  The bladder is decompressed.  The unenhanced appearance of the liver, spleen, gallbladder, pancreas, adrenal glands, abdominal aorta, inferior vena cava, and retroperitoneal lymph nodes is unremarkable.  The stomach, small bowel, and colon are decompressed.  Pelvis:  No free or loculated pelvic fluid collections.  No inflammatory changes demonstrated in the pelvis.  The appendix is not identified.  No significant pelvic lymphadenopathy.  Normal alignment of the lumbar vertebrae.  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  Nonobstructing stone in the upper pole of the right kidney.  No obstructing ureteral stones identified.   Original Report Authenticated By: Burman Nieves, M.D.    2:31 AM Patient feeling better at this time, wants to go home. CT and urinalysis consistent with a passed stone. Will culture urine for possible UTI.      Hanley Seamen, MD 04/26/13 [phone]

## 2013-04-26 NOTE — ED Notes (Signed)
Patient transported to CT 

## 2013-04-26 NOTE — ED Notes (Signed)
Pt states "had kidney stone before and this feels the same but Im scared to death, I dont want to have a stent again because that was awful"

## 2013-06-01 ENCOUNTER — Emergency Department (HOSPITAL_BASED_OUTPATIENT_CLINIC_OR_DEPARTMENT_OTHER)
Admission: EM | Admit: 2013-06-01 | Discharge: 2013-06-01 | Disposition: A | Discharge status: Home / Self Care | Payer: Federal, State, Local not specified - PPO | Attending: Emergency Medicine | Admitting: Emergency Medicine

## 2013-06-01 ENCOUNTER — Encounter (HOSPITAL_BASED_OUTPATIENT_CLINIC_OR_DEPARTMENT_OTHER): Payer: Self-pay | Admitting: *Deleted

## 2013-06-01 DIAGNOSIS — Z23 Encounter for immunization: Secondary | ICD-10-CM | POA: Insufficient documentation

## 2013-06-01 DIAGNOSIS — J45909 Unspecified asthma, uncomplicated: Secondary | ICD-10-CM | POA: Insufficient documentation

## 2013-06-01 DIAGNOSIS — IMO0001 Reserved for inherently not codable concepts without codable children: Secondary | ICD-10-CM | POA: Insufficient documentation

## 2013-06-01 DIAGNOSIS — Z79899 Other long term (current) drug therapy: Secondary | ICD-10-CM | POA: Insufficient documentation

## 2013-06-01 DIAGNOSIS — Z87442 Personal history of urinary calculi: Secondary | ICD-10-CM | POA: Insufficient documentation

## 2013-06-01 DIAGNOSIS — F17200 Nicotine dependence, unspecified, uncomplicated: Secondary | ICD-10-CM | POA: Insufficient documentation

## 2013-06-01 DIAGNOSIS — Y9389 Activity, other specified: Secondary | ICD-10-CM | POA: Insufficient documentation

## 2013-06-01 DIAGNOSIS — S81009A Unspecified open wound, unspecified knee, initial encounter: Secondary | ICD-10-CM | POA: Insufficient documentation

## 2013-06-01 DIAGNOSIS — T148XXA Other injury of unspecified body region, initial encounter: Secondary | ICD-10-CM

## 2013-06-01 DIAGNOSIS — Y92009 Unspecified place in unspecified non-institutional (private) residence as the place of occurrence of the external cause: Secondary | ICD-10-CM | POA: Insufficient documentation

## 2013-06-01 MED ORDER — RABIES VACCINE, PCEC IM SUSR
1.0000 mL | Freq: Once | INTRAMUSCULAR | Status: AC
  Administered 2013-06-01: 1 mL via INTRAMUSCULAR
  Filled 2013-06-01: qty 1

## 2013-06-01 MED ORDER — RABIES IMMUNE GLOBULIN 150 UNIT/ML IM INJ
20.0000 [IU]/kg | INJECTION | Freq: Once | INTRAMUSCULAR | Status: AC
  Administered 2013-06-01: 1275 [IU]
  Filled 2013-06-01: qty 8.5

## 2013-06-01 NOTE — ED Notes (Signed)
Awaiting information from flow manager for scheduled times of additional rabies vaccines, pt updated on delay
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
## 2013-06-01 NOTE — ED Notes (Signed)
Information given to patient for follow up injections, phone number to main campus ER [phone] or [facility] [phone], pt informed that [place] [facility] is out of vaccine at this time

## 2013-06-04 ENCOUNTER — Encounter (HOSPITAL_COMMUNITY): Payer: Self-pay | Admitting: Family Medicine

## 2013-06-04 ENCOUNTER — Emergency Department (HOSPITAL_COMMUNITY)
Admission: EM | Admit: 2013-06-04 | Discharge: 2013-06-04 | Disposition: A | Discharge status: Home / Self Care | Payer: Federal, State, Local not specified - PPO | Attending: Emergency Medicine | Admitting: Emergency Medicine

## 2013-06-04 DIAGNOSIS — Z87442 Personal history of urinary calculi: Secondary | ICD-10-CM | POA: Insufficient documentation

## 2013-06-04 DIAGNOSIS — J45909 Unspecified asthma, uncomplicated: Secondary | ICD-10-CM | POA: Insufficient documentation

## 2013-06-04 DIAGNOSIS — IMO0001 Reserved for inherently not codable concepts without codable children: Secondary | ICD-10-CM | POA: Insufficient documentation

## 2013-06-04 DIAGNOSIS — T148XXA Other injury of unspecified body region, initial encounter: Secondary | ICD-10-CM

## 2013-06-04 DIAGNOSIS — F17200 Nicotine dependence, unspecified, uncomplicated: Secondary | ICD-10-CM | POA: Insufficient documentation

## 2013-06-04 DIAGNOSIS — Z23 Encounter for immunization: Secondary | ICD-10-CM | POA: Insufficient documentation

## 2013-06-04 DIAGNOSIS — IMO0002 Reserved for concepts with insufficient information to code with codable children: Secondary | ICD-10-CM | POA: Insufficient documentation

## 2013-06-04 DIAGNOSIS — Y929 Unspecified place or not applicable: Secondary | ICD-10-CM | POA: Insufficient documentation

## 2013-06-04 DIAGNOSIS — Y9389 Activity, other specified: Secondary | ICD-10-CM | POA: Insufficient documentation

## 2013-06-04 DIAGNOSIS — Z79899 Other long term (current) drug therapy: Secondary | ICD-10-CM | POA: Insufficient documentation

## 2013-06-04 MED ORDER — RABIES VACCINE, PCEC IM SUSR
1.0000 mL | Freq: Once | INTRAMUSCULAR | Status: AC
  Administered 2013-06-04: 1 mL via INTRAMUSCULAR
  Filled 2013-06-04 (×2): qty 1

## 2013-06-04 MED ORDER — TETANUS-DIPHTH-ACELL PERTUSSIS 5-2.5-18.5 LF-MCG/0.5 IM SUSP
0.5000 mL | Freq: Once | INTRAMUSCULAR | Status: AC
  Administered 2013-06-04: 0.5 mL via INTRAMUSCULAR
  Filled 2013-06-04: qty 0.5

## 2013-06-04 NOTE — ED Provider Notes (Signed)
History    This chart was scribed for non-physician practitioner working with Loren Racer, MD by Smitty Pluck, ED scribe. This patient was seen in room TR05C/TR05C and the patient's care was started at 3:27PM.   CSN: [id number]  Arrival date & time 06/04/13  1455   Chief Complaint  Patient presents with  . Animal Bite     The history is provided by the patient and medical records. No language interpreter was used.   HPI Comments: Nathan Tanner is a 22 y.o. male who presents to the Emergency Department complaining of being bitten by fox 3 days ago. Pt reports that he was seen in ED and given 1st round of rabies vaccinations and that he currently needs the 2nd round. He states that he was bitten on his leg by the fox as he entered into his dog lot. Pt denies drainage from site of bite, fever, chills, nausea, vomiting, diarrhea, weakness, cough, SOB and any other pain.   Past Medical History  Diagnosis Date  . Kidney stones   . Asthma     Past Surgical History  Procedure Laterality Date  . Lithotripsy      History reviewed. No pertinent family history.  History  Substance Use Topics  . Smoking status: Current Every Day Smoker  . Smokeless tobacco: Never Used  . Alcohol Use: No      Review of Systems  Constitutional: Negative for chills.  Respiratory: Negative for shortness of breath.   Gastrointestinal: Negative for nausea and vomiting.  Neurological: Negative for weakness.    Allergies  Bee venom  Home Medications   Current Outpatient Rx  Name  Route  Sig  Dispense  Refill  . albuterol (PROVENTIL HFA;VENTOLIN HFA) 108 (90 BASE) MCG/ACT inhaler   Inhalation   Inhale 2 puffs into the lungs every 6 (six) hours as needed. For shortness of breath and wheezing          . Fluticasone-Salmeterol (ADVAIR) 250-50 MCG/DOSE AEPB   Inhalation   Inhale 1 puff into the lungs every 12 (twelve) hours.           . naproxen sodium (ANAPROX) 220 MG tablet   Oral   Take  440 mg by mouth daily as needed (for pain).         Marland Kitchen EPINEPHrine (EPIPEN) 0.3 mg/0.3 mL DEVI   Intramuscular   Inject 0.3 mLs (0.3 mg total) into the muscle as needed.   1 Device   2     BP 102/53  Pulse 67  Temp(Src) 98.3 F (36.8 C)  Resp 16  SpO2 100%  Physical Exam  Nursing note and vitals reviewed. Constitutional: He is oriented to person, place, and time. He appears well-developed and well-nourished. No distress.  HENT:  Head: Normocephalic and atraumatic.  Eyes: EOM are normal.  Neck: Neck supple. No tracheal deviation present.  Cardiovascular: Normal rate, regular rhythm and normal heart sounds.   Pulmonary/Chest: Effort normal and breath sounds normal. No respiratory distress. He has no wheezes. He has no rales.  Musculoskeletal: Normal range of motion.  Neurological: He is alert and oriented to person, place, and time.  Skin: Skin is warm and dry.  Psychiatric: He has a normal mood and affect. His behavior is normal.    ED Course  Procedures (including critical care time) DIAGNOSTIC STUDIES: Oxygen Saturation is 100% on room air, normal by my interpretation.    COORDINATION OF CARE: 3:30 PM Discussed ED treatment with pt and pt agrees.  Labs Reviewed - No data to display No results found.   1. Need for rabies vaccination   2. Animal bite       MDM  Pt given second rabies series for fox bite:pt also given tetanus:no sign of infection     I personally performed the services described in this documentation, which was scribed in my presence. The recorded information has been reviewed and is accurate.    Teressa Lower, NP 06/04/13 [phone]

## 2013-06-04 NOTE — ED Notes (Addendum)
Per pt was bit on the back of his leg by a fox and [facility]. st[facility] here for vaccine.

## 2013-06-08 ENCOUNTER — Emergency Department (HOSPITAL_COMMUNITY)
Admission: EM | Admit: 2013-06-08 | Discharge: 2013-06-08 | Disposition: A | Discharge status: Home / Self Care | Payer: Federal, State, Local not specified - PPO | Attending: Emergency Medicine | Admitting: Emergency Medicine

## 2013-06-08 ENCOUNTER — Encounter (HOSPITAL_COMMUNITY): Payer: Self-pay | Admitting: Emergency Medicine

## 2013-06-08 DIAGNOSIS — F1210 Cannabis abuse, uncomplicated: Secondary | ICD-10-CM | POA: Insufficient documentation

## 2013-06-08 DIAGNOSIS — Z87442 Personal history of urinary calculi: Secondary | ICD-10-CM | POA: Insufficient documentation

## 2013-06-08 DIAGNOSIS — F17200 Nicotine dependence, unspecified, uncomplicated: Secondary | ICD-10-CM | POA: Insufficient documentation

## 2013-06-08 DIAGNOSIS — J45909 Unspecified asthma, uncomplicated: Secondary | ICD-10-CM | POA: Insufficient documentation

## 2013-06-08 DIAGNOSIS — IMO0002 Reserved for concepts with insufficient information to code with codable children: Secondary | ICD-10-CM | POA: Insufficient documentation

## 2013-06-08 DIAGNOSIS — Z23 Encounter for immunization: Secondary | ICD-10-CM | POA: Insufficient documentation

## 2013-06-08 DIAGNOSIS — Z79899 Other long term (current) drug therapy: Secondary | ICD-10-CM | POA: Insufficient documentation

## 2013-06-08 MED ORDER — RABIES VACCINE, PCEC IM SUSR
1.0000 mL | Freq: Once | INTRAMUSCULAR | Status: AC
  Administered 2013-06-08: 1 mL via INTRAMUSCULAR
  Filled 2013-06-08: qty 1

## 2013-06-08 NOTE — ED Notes (Signed)
Pt here for third round of shots for animal bite that took place 06/01/13.

## 2013-06-08 NOTE — ED Provider Notes (Signed)
History    This chart was scribed for non-physician practitioner Magnus Sinning, PA-C working with Doug Sou, MD by Toya Smothers, ED Scribe. This patient was seen in room TR10C/TR10C and the patient's care was started at 6:48 PM.   CSN: [id number]  Arrival date & time 06/08/13  1714   First MD Initiated Contact with Patient 06/08/13 1753      Chief Complaint  Patient presents with  . Rabies Injection    The history is provided by the patient. No language interpreter was used.    HPI Comments: Nathan Tanner is a 22 y.o. male with h/o asthma, who presents to the Emergency Department to receive a third rabies vaccination. Pt received his last injection on 06/04/13 and was bitten 06/01/13. Per Pt, there have been no complications with healing or signs of infection. Pt denies headache, diaphoresis, fever, chills, nausea, vomiting, diarrhea, weakness, cough, SOB and any other pain. Pt is a current everyday smoker, admitting marijuana and denying alcohol use.    Past Medical History  Diagnosis Date  . Kidney stones   . Asthma     Past Surgical History  Procedure Laterality Date  . Lithotripsy      History reviewed. No pertinent family history.  History  Substance Use Topics  . Smoking status: Current Every Day Smoker  . Smokeless tobacco: Never Used  . Alcohol Use: No      Review of Systems  Allergies  Bee venom  Home Medications   Current Outpatient Rx  Name  Route  Sig  Dispense  Refill  . albuterol (PROVENTIL HFA;VENTOLIN HFA) 108 (90 BASE) MCG/ACT inhaler   Inhalation   Inhale 2 puffs into the lungs every 6 (six) hours as needed for wheezing or shortness of breath. For shortness of breath and wheezing         . EPINEPHrine (EPI-PEN) 0.3 mg/0.3 mL DEVI   Intramuscular   Inject 0.3 mg into the muscle daily as needed (Anaphylaxis).         . Fluticasone-Salmeterol (ADVAIR) 250-50 MCG/DOSE AEPB   Inhalation   Inhale 1 puff into the lungs every 12  (twelve) hours as needed (Asthma).            BP 97/51  Pulse 85  Temp(Src) 98.7 F (37.1 C) (Oral)  Resp 16  SpO2 97%  Physical Exam  Nursing note and vitals reviewed. Constitutional: He is oriented to person, place, and time. He appears well-developed and well-nourished. No distress.  HENT:  Head: Normocephalic and atraumatic.  Eyes: EOM are normal.  Neck: Neck supple. No tracheal deviation present.  Cardiovascular: Normal rate.   Pulmonary/Chest: Effort normal. No respiratory distress.  Musculoskeletal: Normal range of motion.  Neurological: He is alert and oriented to person, place, and time.  Skin: Skin is warm and dry.  Small scab on the left lateral thigh  Psychiatric: He has a normal mood and affect. His behavior is normal.    ED Course  Procedures DIAGNOSTIC STUDIES: Oxygen Saturation is 97% on room air, normal by my interpretation.    COORDINATION OF CARE: 18:47- Evaluated Pt. Pt is awake, alert, and without distress. 18:50- Patient understands and agrees with initial ED impression and plan with expectations set for ED visit.     Labs Reviewed - No data to display No results found.   No diagnosis found.    MDM  Patient presenting requesting third rabies vaccine.  No signs of infection of the area of the bite.  Patient given vaccine in the ED.  I personally performed the services described in this documentation, which was scribed in my presence. The recorded information has been reviewed and is accurate.    Pascal Lux [place], PA-C 06/14/13 1456
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
## 2013-06-19 NOTE — ED Provider Notes (Signed)
Medical screening examination/treatment/procedure(s) were performed by non-physician practitioner and as supervising physician I was immediately available for consultation/collaboration.  Doug Sou, MD 06/19/13 [phone]

## 2013-06-19 NOTE — ED Provider Notes (Signed)
Medical screening examination/treatment/procedure(s) were performed by non-physician practitioner and as supervising physician I was immediately available for consultation/collaboration.  Doug Sou, MD 06/19/13 [phone]

## 2013-06-25 NOTE — ED Provider Notes (Signed)
Medical screening examination/treatment/procedure(s) were performed by non-physician practitioner and as supervising physician I was immediately available for consultation/collaboration.   Loren Racer, MD 06/25/13 [phone]

## 2013-08-27 DIAGNOSIS — T148XXA Other injury of unspecified body region, initial encounter: Secondary | ICD-10-CM

## 2013-08-27 HISTORY — DX: Other injury of unspecified body region, initial encounter: T14.8XXA

## 2013-10-26 ENCOUNTER — Emergency Department (HOSPITAL_BASED_OUTPATIENT_CLINIC_OR_DEPARTMENT_OTHER)
Admission: EM | Admit: 2013-10-26 | Discharge: 2013-10-26 | Disposition: A | Discharge status: Home / Self Care | Payer: No Typology Code available for payment source | Attending: Emergency Medicine | Admitting: Emergency Medicine

## 2013-10-26 ENCOUNTER — Encounter (HOSPITAL_BASED_OUTPATIENT_CLINIC_OR_DEPARTMENT_OTHER): Payer: Self-pay | Admitting: Emergency Medicine

## 2013-10-26 DIAGNOSIS — J45909 Unspecified asthma, uncomplicated: Secondary | ICD-10-CM | POA: Insufficient documentation

## 2013-10-26 DIAGNOSIS — K529 Noninfective gastroenteritis and colitis, unspecified: Secondary | ICD-10-CM

## 2013-10-26 DIAGNOSIS — Z87891 Personal history of nicotine dependence: Secondary | ICD-10-CM | POA: Insufficient documentation

## 2013-10-26 DIAGNOSIS — Z79899 Other long term (current) drug therapy: Secondary | ICD-10-CM | POA: Insufficient documentation

## 2013-10-26 DIAGNOSIS — K5289 Other specified noninfective gastroenteritis and colitis: Secondary | ICD-10-CM | POA: Insufficient documentation

## 2013-10-26 DIAGNOSIS — Z87442 Personal history of urinary calculi: Secondary | ICD-10-CM | POA: Insufficient documentation

## 2013-10-26 LAB — COMPREHENSIVE METABOLIC PANEL
AST: 22 U/L (ref 0–37)
Albumin: 4.8 g/dL (ref 3.5–5.2)
Alkaline Phosphatase: 77 U/L (ref 39–117)
Chloride: 102 mEq/L (ref 96–112)
Creatinine, Ser: 1 mg/dL (ref 0.50–1.35)
Potassium: 3.6 mEq/L (ref 3.5–5.1)
Sodium: 140 mEq/L (ref 135–145)
Total Bilirubin: 0.5 mg/dL (ref 0.3–1.2)

## 2013-10-26 LAB — CBC WITH DIFFERENTIAL/PLATELET
Basophils Absolute: 0 10*3/uL (ref 0.0–0.1)
Basophils Relative: 0 % (ref 0–1)
MCHC: 33.6 g/dL (ref 30.0–36.0)
Neutro Abs: 6.7 10*3/uL (ref 1.7–7.7)
Neutrophils Relative %: 79 % — ABNORMAL HIGH (ref 43–77)
Platelets: 231 10*3/uL (ref 150–400)
RDW: 11.9 % (ref 11.5–15.5)

## 2013-10-26 MED ORDER — ONDANSETRON HCL 4 MG PO TABS: 4.0000 mg | ORAL_TABLET | Freq: Three times a day (TID) | ORAL | Status: DC | PRN

## 2013-10-26 MED ORDER — SODIUM CHLORIDE 0.9 % IV BOLUS (SEPSIS)
1000.0000 mL | Freq: Once | INTRAVENOUS | Status: AC
  Administered 2013-10-26: 1000 mL via INTRAVENOUS

## 2013-10-26 MED ORDER — ONDANSETRON HCL 4 MG/2ML IJ SOLN
4.0000 mg | Freq: Once | INTRAMUSCULAR | Status: AC
  Administered 2013-10-26: 4 mg via INTRAVENOUS
  Filled 2013-10-26: qty 2

## 2013-10-26 MED ORDER — HYDROMORPHONE HCL PF 1 MG/ML IJ SOLN
1.0000 mg | Freq: Once | INTRAMUSCULAR | Status: AC
  Administered 2013-10-26: 1 mg via INTRAVENOUS
  Filled 2013-10-26: qty 1

## 2013-10-26 NOTE — ED Notes (Signed)
Abdominal pain, nausea, vomiting and diarrhea that started this am.

## 2013-10-26 NOTE — ED Provider Notes (Signed)
CSN: [id number]     Arrival date & time 10/26/13  1256 History   First MD Initiated Contact with Patient 10/26/13 1305     Chief Complaint  Patient presents with  . Nausea  . Emesis  . Diarrhea    HPI Patient presents with 6 are history of abdominal pain associated with nausea vomiting and diarrhea as a chills.  Patient has no previous medical history.  Has no recent sick contacts.  Has had no history of travel outside the [place].  Has not been in contact with anyone traveling outside the [place]. Past Medical History  Diagnosis Date  . Kidney stones   . Asthma    Past Surgical History  Procedure Laterality Date  . Lithotripsy     No family history on file. History  Substance Use Topics  . Smoking status: Former Games developer  . Smokeless tobacco: Never Used  . Alcohol Use: No    Review of Systems  All other systems reviewed and are negative.    Allergies  Bee venom  Home Medications   Current Outpatient Rx  Name  Route  Sig  Dispense  Refill  . albuterol (PROVENTIL HFA;VENTOLIN HFA) 108 (90 BASE) MCG/ACT inhaler   Inhalation   Inhale 2 puffs into the lungs every 6 (six) hours as needed for wheezing or shortness of breath. For shortness of breath and wheezing         . EPINEPHrine (EPI-PEN) 0.3 mg/0.3 mL DEVI   Intramuscular   Inject 0.3 mg into the muscle daily as needed (Anaphylaxis).         . Fluticasone-Salmeterol (ADVAIR) 250-50 MCG/DOSE AEPB   Inhalation   Inhale 1 puff into the lungs every 12 (twelve) hours as needed (Asthma).          . ondansetron (ZOFRAN) 4 MG tablet   Oral   Take 1 tablet (4 mg total) by mouth every 8 (eight) hours as needed for nausea.   15 tablet   0    BP 118/64  Pulse 63  Temp(Src) 97.5 F (36.4 C) (Oral)  Resp 18  SpO2 100% Physical Exam  Nursing note and vitals reviewed. Constitutional: He is oriented to person, place, and time. He appears well-developed and well-nourished. He appears distressed  (Patient appears nauseated).  HENT:  Head: Normocephalic and atraumatic.  Eyes: Pupils are equal, round, and reactive to light.  Neck: Normal range of motion.  Cardiovascular: Normal rate and intact distal pulses.   Pulmonary/Chest: No respiratory distress.  Abdominal: Soft. Normal appearance. He exhibits no distension. There is tenderness (Periumbilical). There is no rebound and no guarding.  Musculoskeletal: Normal range of motion.  Neurological: He is alert and oriented to person, place, and time. No cranial nerve deficit.  Skin: Skin is warm and dry. No rash noted.  Psychiatric: He has a normal mood and affect. His behavior is normal.    ED Course  Procedures (including critical care time) Medications  sodium chloride 0.9 % bolus 1,000 mL (1,000 mLs Intravenous New Bag/Given 10/26/13 1359)  sodium chloride 0.9 % bolus 1,000 mL (0 mLs Intravenous Stopped 10/26/13 1359)  HYDROmorphone (DILAUDID) injection 1 mg (1 mg Intravenous Given 10/26/13 1315)  ondansetron (ZOFRAN) injection 4 mg (4 mg Intravenous Given 10/26/13 1315)   patient resting comfortably with no more vomiting or diarrhea.  Plan administer one more liter of fluids if patient remains comfortable we'll discharge home with instructions for gastroenteritis. Labs Review Labs Reviewed  CBC WITH DIFFERENTIAL -  Abnormal; Notable for the following:    Neutrophils Relative % 79 (*)    All other components within normal limits  COMPREHENSIVE METABOLIC PANEL - Abnormal; Notable for the following:    Glucose, Bld 140 (*)    All other components within normal limits  LIPASE, BLOOD   Imaging Review No results found.    MDM   1. Gastroenteritis        Nelia Shi, MD 10/26/13 1451

## 2013-10-26 NOTE — ED Notes (Signed)
MD at bedside. 

## 2013-12-13 ENCOUNTER — Emergency Department (HOSPITAL_BASED_OUTPATIENT_CLINIC_OR_DEPARTMENT_OTHER)
Admission: EM | Admit: 2013-12-13 | Discharge: 2013-12-13 | Disposition: A | Discharge status: Home / Self Care | Payer: Self-pay | Attending: Emergency Medicine | Admitting: Emergency Medicine

## 2013-12-13 ENCOUNTER — Encounter (HOSPITAL_BASED_OUTPATIENT_CLINIC_OR_DEPARTMENT_OTHER): Payer: Self-pay | Admitting: Emergency Medicine

## 2013-12-13 ENCOUNTER — Emergency Department (HOSPITAL_BASED_OUTPATIENT_CLINIC_OR_DEPARTMENT_OTHER): Payer: Self-pay

## 2013-12-13 DIAGNOSIS — Z87891 Personal history of nicotine dependence: Secondary | ICD-10-CM | POA: Insufficient documentation

## 2013-12-13 DIAGNOSIS — N201 Calculus of ureter: Secondary | ICD-10-CM | POA: Insufficient documentation

## 2013-12-13 DIAGNOSIS — J45909 Unspecified asthma, uncomplicated: Secondary | ICD-10-CM | POA: Insufficient documentation

## 2013-12-13 DIAGNOSIS — Z79899 Other long term (current) drug therapy: Secondary | ICD-10-CM | POA: Insufficient documentation

## 2013-12-13 DIAGNOSIS — IMO0002 Reserved for concepts with insufficient information to code with codable children: Secondary | ICD-10-CM | POA: Insufficient documentation

## 2013-12-13 LAB — URINALYSIS, ROUTINE W REFLEX MICROSCOPIC
Glucose, UA: NEGATIVE mg/dL
Ketones, ur: 15 mg/dL — AB
pH: 6.5 (ref 5.0–8.0)

## 2013-12-13 LAB — URINE MICROSCOPIC-ADD ON

## 2013-12-13 MED ORDER — ONDANSETRON HCL 4 MG/2ML IJ SOLN
4.0000 mg | Freq: Once | INTRAMUSCULAR | Status: AC
  Administered 2013-12-13: 4 mg via INTRAVENOUS
  Filled 2013-12-13: qty 2

## 2013-12-13 MED ORDER — OXYCODONE-ACETAMINOPHEN 5-325 MG PO TABS: 1.0000 | ORAL_TABLET | ORAL | Status: DC | PRN

## 2013-12-13 MED ORDER — HYDROMORPHONE HCL PF 1 MG/ML IJ SOLN
0.5000 mg | Freq: Once | INTRAMUSCULAR | Status: AC
  Administered 2013-12-13: 0.5 mg via INTRAVENOUS
  Filled 2013-12-13: qty 1

## 2013-12-13 MED ORDER — ONDANSETRON HCL 4 MG PO TABS: 4.0000 mg | ORAL_TABLET | Freq: Four times a day (QID) | ORAL | Status: DC

## 2013-12-13 NOTE — ED Notes (Signed)
Pt amb to triage with quick steady gait smiling in nad. Reports 4 days of right flank pain, his usual renal stone sx, states he was trying to stay home "and just pass it on my own" but today noticed blood in his urine and became concerned.

## 2013-12-13 NOTE — ED Provider Notes (Signed)
Medical screening examination/treatment/procedure(s) were performed by non-physician practitioner and as supervising physician I was immediately available for consultation/collaboration.  EKG Interpretation   None         Glynn Octave, MD 12/13/13 2159

## 2013-12-13 NOTE — ED Provider Notes (Addendum)
CSN: [id number]     Arrival date & time 12/13/13  1715 History   First MD Initiated Contact with Patient 12/13/13 1733     Chief Complaint  Patient presents with  . Flank Pain   (Consider location/radiation/quality/duration/timing/severity/associated sxs/prior Treatment) Patient is a 22 y.o. male presenting with flank pain. The history is provided by the patient. No language interpreter was used.  Flank Pain This is a recurrent problem. Associated symptoms include nausea and vomiting. Pertinent negatives include no chills or fever. Associated symptoms comments: Right flank pain for the past 4 days that reminded him of pain experienced with previous kidney stones. No fever. He has nausea with the worse pain. He did not seen any blood in his urine until today, prompting ED evaluation. .    Past Medical History  Diagnosis Date  . Kidney stones   . Asthma    Past Surgical History  Procedure Laterality Date  . Lithotripsy     History reviewed. No pertinent family history. History  Substance Use Topics  . Smoking status: Former Games developer  . Smokeless tobacco: Never Used  . Alcohol Use: No    Review of Systems  Constitutional: Negative for fever and chills.  Gastrointestinal: Positive for nausea and vomiting.  Genitourinary: Positive for hematuria and flank pain.  Musculoskeletal: Negative.   Skin: Negative.   Neurological: Negative.     Allergies  Bee venom  Home Medications   Current Outpatient Rx  Name  Route  Sig  Dispense  Refill  . albuterol (PROVENTIL HFA;VENTOLIN HFA) 108 (90 BASE) MCG/ACT inhaler   Inhalation   Inhale 2 puffs into the lungs every 6 (six) hours as needed for wheezing or shortness of breath. For shortness of breath and wheezing         . EPINEPHrine (EPI-PEN) 0.3 mg/0.3 mL DEVI   Intramuscular   Inject 0.3 mg into the muscle daily as needed (Anaphylaxis).         . Fluticasone-Salmeterol (ADVAIR) 250-50 MCG/DOSE AEPB   Inhalation   Inhale 1  puff into the lungs every 12 (twelve) hours as needed (Asthma).          . ondansetron (ZOFRAN) 4 MG tablet   Oral   Take 1 tablet (4 mg total) by mouth every 8 (eight) hours as needed for nausea.   15 tablet   0    BP 116/56  Pulse 62  Temp(Src) 98.9 F (37.2 C) (Oral)  Resp 18  SpO2 100% Physical Exam  Constitutional: He is oriented to person, place, and time. He appears well-developed and well-nourished. No distress.  Pulmonary/Chest: Effort normal.  Abdominal: Soft.  Mild right upper and lower abdominal tenderness without rebound.  Genitourinary:  Mild right CVA tenderness.   Musculoskeletal: Normal range of motion.  Neurological: He is alert and oriented to person, place, and time.  Skin: Skin is warm and dry.  Psychiatric: He has a normal mood and affect.    ED Course  Procedures (including critical care time) Labs Review Labs Reviewed  URINALYSIS, ROUTINE W REFLEX MICROSCOPIC - Abnormal; Notable for the following:    Hgb urine dipstick MODERATE (*)    Ketones, ur 15 (*)    Protein, ur 30 (*)    Leukocytes, UA SMALL (*)    All other components within normal limits  URINE MICROSCOPIC-ADD ON   Results for orders placed during the hospital encounter of 12/13/13  URINALYSIS, ROUTINE W REFLEX MICROSCOPIC      Result Value Range  Color, Urine YELLOW  YELLOW   APPearance CLEAR  CLEAR   Specific Gravity, Urine 1.025  1.005 - 1.030   pH 6.5  5.0 - 8.0   Glucose, UA NEGATIVE  NEGATIVE mg/dL   Hgb urine dipstick MODERATE (*) NEGATIVE   Bilirubin Urine NEGATIVE  NEGATIVE   Ketones, ur 15 (*) NEGATIVE mg/dL   Protein, ur 30 (*) NEGATIVE mg/dL   Urobilinogen, UA 1.0  0.0 - 1.0 mg/dL   Nitrite NEGATIVE  NEGATIVE   Leukocytes, UA SMALL (*) NEGATIVE  URINE MICROSCOPIC-ADD ON      Result Value Range   Squamous Epithelial / LPF RARE  RARE   WBC, UA 3-6  <3 WBC/hpf   RBC / HPF 21-50  <3 RBC/hpf   Bacteria, UA FEW (*) RARE   Urine-Other MUCOUS PRESENT     Dg Abd 1  View  12/13/2013   CLINICAL DATA:  Flank pain and hematuria  EXAM: ABDOMEN - 1 VIEW  COMPARISON:  CT abdomen and pelvis Apr 26, 2013  FINDINGS: There is a 4 mm calcification at the level of L1-2 on the right. No other abnormal calcification identified. Bowel gas pattern is normal. No obstruction or free air is seen on this supine examination.  IMPRESSION: Question proximal ureteral calculus on the right at L1-2. Bowel gas pattern unremarkable.   Electronically Signed   By: Bretta Bang M.D.   On: 12/13/2013 18:15   Imaging Review No results found.  EKG Interpretation   None       MDM  No diagnosis found. 1. Ureteral stone  Stone visualized on plain film as 4 mm and in mid-distal ureter on right. He can be discharged home and will be encouraged to follow up with urology as needed.    Arnoldo Hooker, PA-C 12/13/13 1835  Arnoldo Hooker, PA-C 01/16/14 1517

## 2013-12-15 LAB — URINE CULTURE: Colony Count: NO GROWTH

## 2014-01-17 NOTE — ED Provider Notes (Signed)
Medical screening examination/treatment/procedure(s) were performed by non-physician practitioner and as supervising physician I was immediately available for consultation/collaboration.  EKG Interpretation   None        Glynn OctaveStephen Demarion Pondexter, MD 01/17/14 1154

## 2014-02-08 ENCOUNTER — Encounter (HOSPITAL_BASED_OUTPATIENT_CLINIC_OR_DEPARTMENT_OTHER): Payer: Self-pay | Admitting: Emergency Medicine

## 2014-02-08 ENCOUNTER — Emergency Department (HOSPITAL_BASED_OUTPATIENT_CLINIC_OR_DEPARTMENT_OTHER)
Admission: EM | Admit: 2014-02-08 | Discharge: 2014-02-08 | Disposition: A | Discharge status: Home / Self Care | Payer: Federal, State, Local not specified - PPO | Attending: Emergency Medicine | Admitting: Emergency Medicine

## 2014-02-08 ENCOUNTER — Emergency Department (HOSPITAL_BASED_OUTPATIENT_CLINIC_OR_DEPARTMENT_OTHER): Payer: Federal, State, Local not specified - PPO

## 2014-02-08 DIAGNOSIS — Z87891 Personal history of nicotine dependence: Secondary | ICD-10-CM | POA: Insufficient documentation

## 2014-02-08 DIAGNOSIS — Z79899 Other long term (current) drug therapy: Secondary | ICD-10-CM | POA: Insufficient documentation

## 2014-02-08 DIAGNOSIS — N200 Calculus of kidney: Secondary | ICD-10-CM

## 2014-02-08 DIAGNOSIS — J45909 Unspecified asthma, uncomplicated: Secondary | ICD-10-CM | POA: Insufficient documentation

## 2014-02-08 LAB — CBC WITH DIFFERENTIAL/PLATELET
Basophils Absolute: 0 10*3/uL (ref 0.0–0.1)
Basophils Relative: 0 % (ref 0–1)
Eosinophils Absolute: 0.1 10*3/uL (ref 0.0–0.7)
Eosinophils Relative: 2 % (ref 0–5)
HEMATOCRIT: 39.3 % (ref 39.0–52.0)
Hemoglobin: 13.1 g/dL (ref 13.0–17.0)
LYMPHS ABS: 2.5 10*3/uL (ref 0.7–4.0)
LYMPHS PCT: 51 % — AB (ref 12–46)
MCH: 29.9 pg (ref 26.0–34.0)
MCHC: 33.3 g/dL (ref 30.0–36.0)
MCV: 89.7 fL (ref 78.0–100.0)
Monocytes Absolute: 0.5 10*3/uL (ref 0.1–1.0)
Monocytes Relative: 10 % (ref 3–12)
Neutro Abs: 1.8 10*3/uL (ref 1.7–7.7)
Neutrophils Relative %: 37 % — ABNORMAL LOW (ref 43–77)
PLATELETS: 252 10*3/uL (ref 150–400)
RBC: 4.38 MIL/uL (ref 4.22–5.81)
RDW: 11.8 % (ref 11.5–15.5)
WBC: 5 10*3/uL (ref 4.0–10.5)

## 2014-02-08 LAB — URINE MICROSCOPIC-ADD ON

## 2014-02-08 LAB — BASIC METABOLIC PANEL
BUN: 9 mg/dL (ref 6–23)
CALCIUM: 9.8 mg/dL (ref 8.4–10.5)
CO2: 27 mEq/L (ref 19–32)
CREATININE: 1 mg/dL (ref 0.50–1.35)
Chloride: 102 mEq/L (ref 96–112)
GFR calc non Af Amer: >90 mL/min (ref >90)
Glucose, Bld: 97 mg/dL (ref 70–99)
POTASSIUM: 3.6 meq/L — AB (ref 3.7–5.3)
Sodium: 142 mEq/L (ref 137–147)

## 2014-02-08 LAB — URINALYSIS, ROUTINE W REFLEX MICROSCOPIC
Bilirubin Urine: NEGATIVE
GLUCOSE, UA: NEGATIVE mg/dL
KETONES UR: NEGATIVE mg/dL
Nitrite: NEGATIVE
PROTEIN: NEGATIVE mg/dL
Specific Gravity, Urine: 1.006 (ref 1.005–1.030)
UROBILINOGEN UA: 1 mg/dL (ref 0.0–1.0)
pH: 6.5 (ref 5.0–8.0)

## 2014-02-08 MED ORDER — KETOROLAC TROMETHAMINE 30 MG/ML IJ SOLN
30.0000 mg | Freq: Once | INTRAMUSCULAR | Status: AC
  Administered 2014-02-08: 30 mg via INTRAVENOUS

## 2014-02-08 MED ORDER — KETOROLAC TROMETHAMINE 30 MG/ML IJ SOLN
INTRAMUSCULAR | Status: AC
  Filled 2014-02-08: qty 1

## 2014-02-08 MED ORDER — HYDROMORPHONE HCL PF 1 MG/ML IJ SOLN
1.0000 mg | Freq: Once | INTRAMUSCULAR | Status: AC
  Administered 2014-02-08: 1 mg via INTRAVENOUS
  Filled 2014-02-08: qty 1

## 2014-02-08 MED ORDER — OXYCODONE-ACETAMINOPHEN 5-325 MG PO TABS: 2.0000 | ORAL_TABLET | ORAL | Status: DC | PRN

## 2014-02-08 MED ORDER — MORPHINE SULFATE 4 MG/ML IJ SOLN
4.0000 mg | Freq: Once | INTRAMUSCULAR | Status: AC
  Administered 2014-02-08: 4 mg via INTRAVENOUS
  Filled 2014-02-08: qty 1

## 2014-02-08 MED ORDER — ONDANSETRON HCL 4 MG/2ML IJ SOLN
4.0000 mg | Freq: Once | INTRAMUSCULAR | Status: AC
  Administered 2014-02-08: 4 mg via INTRAVENOUS
  Filled 2014-02-08: qty 2

## 2014-02-08 MED ORDER — ONDANSETRON 4 MG PO TBDP: 4.0000 mg | ORAL_TABLET | Freq: Three times a day (TID) | ORAL | Status: DC | PRN

## 2014-02-08 NOTE — ED Notes (Signed)
Right lower quad pain and vomiting on and off x 3 months. States he has a kidney stone he has not been able to pass.

## 2014-02-08 NOTE — ED Provider Notes (Signed)
CSN: [id number]     Arrival date & time 02/08/14  1811 History   First MD Initiated Contact with Patient 02/08/14 1906     Chief Complaint  Patient presents with  . Abdominal Pain     (Consider location/radiation/quality/duration/timing/severity/associated sxs/prior Treatment) HPI Comments: Patient is a 23 year old male with a past medical history of kidney stones who presents with right flank pain for the past 3 months. The pain is located in the right flank and does not radiate. The pain is described as aching and severe. The pain started gradually and progressively worsened since the onset. No alleviating/aggravating factors. The patient has tried nothing for symptoms without relief. Associated symptoms include nausea and vomiting. Patient denies fever, headache, diarrhea, chest pain, SOB, dysuria, constipation.   Past Medical History  Diagnosis Date  . Kidney stones   . Asthma    Past Surgical History  Procedure Laterality Date  . Lithotripsy     No family history on file. History  Substance Use Topics  . Smoking status: Former Games developer  . Smokeless tobacco: Never Used  . Alcohol Use: No    Review of Systems  Constitutional: Negative for fever, chills and fatigue.  HENT: Negative for trouble swallowing.   Eyes: Negative for visual disturbance.  Respiratory: Negative for shortness of breath.   Cardiovascular: Negative for chest pain and palpitations.  Gastrointestinal: Positive for vomiting and abdominal pain. Negative for nausea and diarrhea.  Genitourinary: Positive for flank pain. Negative for dysuria and difficulty urinating.  Musculoskeletal: Negative for arthralgias and neck pain.  Skin: Negative for color change.  Neurological: Negative for dizziness and weakness.  Psychiatric/Behavioral: Negative for dysphoric mood.      Allergies  Bee venom  Home Medications   Current Outpatient Rx  Name  Route  Sig  Dispense  Refill  . albuterol (PROVENTIL HFA;VENTOLIN  HFA) 108 (90 BASE) MCG/ACT inhaler   Inhalation   Inhale 2 puffs into the lungs every 6 (six) hours as needed for wheezing or shortness of breath. For shortness of breath and wheezing         . EPINEPHrine (EPI-PEN) 0.3 mg/0.3 mL DEVI   Intramuscular   Inject 0.3 mg into the muscle daily as needed (Anaphylaxis).         . Fluticasone-Salmeterol (ADVAIR) 250-50 MCG/DOSE AEPB   Inhalation   Inhale 1 puff into the lungs every 12 (twelve) hours as needed (Asthma).          . ondansetron (ZOFRAN) 4 MG tablet   Oral   Take 1 tablet (4 mg total) by mouth every 8 (eight) hours as needed for nausea.   15 tablet   0   . ondansetron (ZOFRAN) 4 MG tablet   Oral   Take 1 tablet (4 mg total) by mouth every 6 (six) hours.   12 tablet   0   . oxyCODONE-acetaminophen (PERCOCET/ROXICET) 5-325 MG per tablet   Oral   Take 1-2 tablets by mouth every 4 (four) hours as needed for severe pain.   20 tablet   0    BP 113/71  Pulse 72  Temp(Src) 98.6 F (37 C) (Oral)  Resp 16  Ht 5\' 11"  (1.803 m)  Wt 126 lb (57.153 kg)  BMI 17.58 kg/m2  SpO2 100% Physical Exam  Nursing note and vitals reviewed. Constitutional: He is oriented to person, place, and time. He appears well-developed and well-nourished. No distress.  HENT:  Head: Normocephalic and atraumatic.  Eyes: Conjunctivae and EOM  are normal.  Neck: Normal range of motion.  Cardiovascular: Normal rate and regular rhythm.  Exam reveals no gallop and no friction rub.   No murmur heard. Pulmonary/Chest: Effort normal and breath sounds normal. He has no wheezes. He has no rales. He exhibits no tenderness.  Abdominal: Soft. He exhibits no distension. There is tenderness. There is no rebound and no guarding.  Mild right side tenderness to palpation. No peritoneal signs or focal tenderness to palpation.   Genitourinary:  Right CVA tenderness to palpation.   Musculoskeletal: Normal range of motion.  Neurological: He is alert and oriented to  person, place, and time. Coordination normal.  Speech is goal-oriented. Moves limbs without ataxia.   Skin: Skin is warm and dry.  Psychiatric: He has a normal mood and affect. His behavior is normal.    ED Course  Procedures (including critical care time) Labs Review Labs Reviewed  URINALYSIS, ROUTINE W REFLEX MICROSCOPIC - Abnormal; Notable for the following:    Hgb urine dipstick MODERATE (*)    Leukocytes, UA SMALL (*)    All other components within normal limits  CBC WITH DIFFERENTIAL - Abnormal; Notable for the following:    Neutrophils Relative % 37 (*)    Lymphocytes Relative 51 (*)    All other components within normal limits  BASIC METABOLIC PANEL - Abnormal; Notable for the following:    Potassium 3.6 (*)    All other components within normal limits  URINE MICROSCOPIC-ADD ON   Imaging Review Ct Abdomen Pelvis Wo Contrast  02/08/2014   CLINICAL DATA:  Right flank pain, history of kidney stones  EXAM: CT ABDOMEN AND PELVIS WITHOUT CONTRAST  TECHNIQUE: Multidetector CT imaging of the abdomen and pelvis was performed following the standard protocol without intravenous contrast.  COMPARISON:  Abdominal radiograph 12/03/2013; prior CT abdomen/pelvis 04/26/2013  FINDINGS: Lower Chest: The lung bases are clear. Visualized cardiac structures are within normal limits for size. No pericardial effusion. Unremarkable visualized distal thoracic esophagus.  Abdomen: Unenhanced CT was performed per clinician order. Lack of IV contrast limits sensitivity and specificity, especially for evaluation of abdominal/pelvic solid viscera. Within these limitations, unremarkable CT appearance of the stomach, duodenum, spleen, adrenal glands and pancreas. Normal hepatic contour and morphology. No discrete hepatic lesion. Gallbladder is unremarkable. No intra or extrahepatic biliary ductal dilatation.  Combination of ill-defined high attenuation material in the renal pyramids of both kidneys along with  additional definite renal calculi. There is a 3.5 mm stone in the interpolar right kidney with an adjacent 5 mm stone. A 9 x 8 x 5 mm stone is present in the proximal right ureter. There may be mild fullness of the right collecting system. The collecting systems are difficult to evaluate given the paucity of intra-abdominal and perinephric fat in the absence of intravenous contrast material.  No evidence of obstruction or focal bowel wall thickening. Normal appendix in the right lower quadrant. The terminal ileum is unremarkable.  Pelvis: Unremarkable bladder, prostate gland and seminal vesicles. No free fluid or suspicious adenopathy.  Bones/Soft Tissues: No acute fracture or aggressive appearing lytic or blastic osseous lesion.  Vascular: No significant atherosclerotic vascular disease, aneurysmal dilatation or acute abnormality. Limited evaluation in the absence of intravenous contrast.  IMPRESSION: 1. Right nephrolithiasis including a 9 x 8 x 5 mm proximal ureteral stone. There is no convincing evidence of hydronephrosis by CT, however evaluation of the renal collecting system is limited by the very thin body habitus of the patient with near-total absence  of perinephric and renal hilar fat. Recommend further evaluation with renal ultrasound to assess for hydronephrosis. 2. 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 Differential considerations include hyperparathyroidism, renal tubular acidosis and medullary sponge kidney.   Electronically Signed   By: Malachy Moan M.D.   On: 02/08/2014 19:57    EKG Interpretation   None       MDM   Final diagnoses:  None    7:09 PM Labs and CT abdomen pelvis pending. Urinalysis pending. Vitals stable and patient afebrile.   9:49 PM CT shows 9x8x67mm kidney stone that is in the proximal ureter. I spoke with on call urology who states the patient can follow up in the office. Patient's pain is  controlled here. Labs and urinalysis unremarkable. Patient will be discharged with percocet and zofran and instructions to follow up with Urology.   Emilia Beck, [place] 02/08/14 2153

## 2014-02-08 NOTE — Discharge Instructions (Signed)
Follow up with Dr. Annabell HowellsWrenn for further evaluation. Take Percocet as needed for pain. Take zofran as needed for nausea. Refer to attached documents for more information.

## 2014-02-09 NOTE — ED Provider Notes (Signed)
Medical screening examination/treatment/procedure(s) were performed by non-physician practitioner and as supervising physician I was immediately available for consultation/collaboration.  EKG Interpretation   None         Candyce ChurnJohn David Cady Hafen III, MD 02/09/14 [phone]

## 2014-02-11 ENCOUNTER — Other Ambulatory Visit: Payer: Self-pay | Admitting: Urology

## 2014-02-12 ENCOUNTER — Encounter (HOSPITAL_COMMUNITY): Payer: Self-pay | Admitting: *Deleted

## 2014-02-12 NOTE — Progress Notes (Signed)
Spoke to patient via phone,history obtained,updated.  Bring blue folder,insurance cards,picture ID,designated driver and living will,POA, if desires (to be placed on chart). Pt states he was told it was "OK to pick up blue folder at [facility] day of ESWL".He was instructed to come to Short Stay after picking up folder at 1445 02/14/14 Reinforced no aspirin(instructions to hold aspirin per your doctor), ibuprofen products 72 hours prior to procedure. No vitamins or herbal medicines 7 days prior to procedure.   Laxative instructions were reviewed by myself according to the Pre_ESL instructions because  pt states he was told he could pick up his blue folder day of ESWL and the office was closed today for patient to call and inquire about his laxative instructions. He was instructed to take laxative as directed 02/13/13 as directed on box between 3-6pm eat a light supper and drink lots of fluids. Take a fleet enema 02/14/14 at 0900. . Wear easy on/off clothing and no jewelry except wedding rings and ear rings. Leave all other valuables at home.   No alcohol 24 hours or recreational marijuana prior to procedure.call md office if you have a cold,sorethroat or fever. Shower or bathe before your treatment. You may have clear liquids up to 4 hours prior to treatment,NO SOLIDS 6 hours prior to treatment.   Verbalizes understanding of instructions

## 2014-02-13 ENCOUNTER — Encounter (HOSPITAL_COMMUNITY): Payer: Self-pay | Admitting: Pharmacy Technician

## 2014-02-13 DIAGNOSIS — N200 Calculus of kidney: Secondary | ICD-10-CM | POA: Diagnosis present

## 2014-02-13 NOTE — H&P (Signed)
story of Present Illness  Nathan Tanner is a 23 yo AA male who had the onset in September of right flank pain.  The pain radiates into the RLQ and is associated with nausea.  He can have some urgency but doesn't have to strain.  He has had gross hematuria.  The pain is severe at times.  He was in the ER Friday.   Past Medical History Problems   1. History of anemia (V12.3)  2. History of asthma (V12.69)  3. History of esophageal reflux (V12.79)  4. History of renal calculi (V13.01)  Surgical History Problems   1. History of Cystoscopy With Insertion Of Ureteral Stent  2. History of Dental Surgery  3. History of Tonsillectomy With Adenoidectomy  Current Meds  1. Albuterol AERS;  Therapy: (Recorded:16Feb2015) to Recorded  2. EPINEPHrine 0.3 MG/0.3ML DEVI;  Therapy: (Recorded:16Feb2015) to Recorded  3. Flonase SUSP;  Therapy: (Recorded:16Feb2015) to Recorded  4. Oxycodone-Acetaminophen 5-325 MG Oral Tablet;  Therapy: (Recorded:16Feb2015) to Recorded  5. Zofran TABS;  Therapy: (Recorded:16Feb2015) to Recorded  Allergies Medication   1. No Known Drug Allergies  Family History Problems   1. Family history of diabetes mellitus (V18.0) : Maternal Grandmother  2. Family history of hypertension (V17.49) : Maternal Grandmother  3. Family history of kidney stones (V18.69) : Father  Social History Problems    Denied: History of Alcohol use   Denied: History of Caffeine use   Current some day smoker (305.1)   Number of children   1 son   Occupation   temporary service  Review of Systems Genitourinary, constitutional, skin, eye, otolaryngeal, hematologic/lymphatic, cardiovascular, pulmonary, endocrine, musculoskeletal, gastrointestinal, neurological and psychiatric system(s) were reviewed and pertinent findings if present are noted.  Gastrointestinal: nausea, vomiting, flank pain, abdominal pain, heartburn and diarrhea.  Constitutional: fever, night sweats and feeling tired  (fatigue).  ENT: sore throat and sinus problems.  Hematologic/Lymphatic: swollen glands.  Respiratory: shortness of breath.  Endocrine: polydipsia.  Musculoskeletal: back pain and joint pain.  Neurological: headache and dizziness.    Vitals Vital Signs [Data Includes: Last 1 Day]  Recorded: 16Feb2015 12:54PM  Height: 6 ft  Weight: 130 lb  BMI Calculated: 17.63 BSA Calculated: 1.77 Blood Pressure: 115 / 57 Temperature: 97.8 F Heart Rate: 61  Physical Exam Constitutional: Well nourished and well developed . No acute distress.  ENT:. The ears and nose are normal in appearance.  Neck: The appearance of the neck is normal and no neck mass is present.  Pulmonary: No respiratory distress and normal respiratory rhythm and effort.  Cardiovascular: Heart rate and rhythm are normal . No peripheral edema.  Abdomen: No masses are palpated. Moderate tenderness in the RLQ is present. moderate right CVA tenderness and no left CVA tenderness. No hernias are palpable. No hepatosplenomegaly noted.  Skin: Normal skin turgor, no visible rash and no visible skin lesions.  Neuro/Psych:. Mood and affect are appropriate.    Results/Data Urine [Data Includes: Last 1 Day]   16Feb2015  COLOR YELLOW   APPEARANCE CLEAR   SPECIFIC GRAVITY 1.020   pH 6.0   GLUCOSE NEG mg/dL  BILIRUBIN NEG   KETONE NEG mg/dL  BLOOD LARGE   PROTEIN TRACE mg/dL  UROBILINOGEN 1 mg/dL  NITRITE NEG   LEUKOCYTE ESTERASE NEG   SQUAMOUS EPITHELIAL/HPF RARE   WBC 0-2 WBC/hpf  RBC 7-10 RBC/hpf  BACTERIA RARE   CRYSTALS NONE SEEN   CASTS NONE SEEN    Old records or history reviewed: ER records reviewed on EPIC.  The following images/tracing/specimen were independently visualized:  I have reviewed his CT films and he has a 5mm right renal stone and a 9mm proximal stone without obstruction. The density is 700HU. KUB today shows a 9x5710mm triangular right proximal stone and a linear RUP stone. No other abnormalities are noted.   The following clinical lab reports were reviewed:  UA reviewed.    Assessment Assessed   1. Calculus of right ureter (592.1)  2. Calculus of right kidney (592.0)   He has a symptomatic right proximal stone with a smaller right renal stone.   Plan Calculus of right ureter   1. Follow-up Schedule Surgery Office  Follow-up  Status: Complete  Done: 16Feb2015  2. KUB; Status:Resulted - Requires Verification;   Done: 16Feb2015 12:00AM Health Maintenance   3. UA With REFLEX; [Do Not Release]; Status:Resulted - Requires Verification;   Done:  16Feb2015 12:34PM    I discussed ureteroscopy vs ESWL and will set him up for ESWL.  The risks of bleeding, infection, injury to the kidney or adjacent organs, skin breakdown, obstructing fragments and need for secondary procedures and sedation risks reviewed.   His stone looks low density and should respond well to treatment.

## 2014-02-14 ENCOUNTER — Encounter (HOSPITAL_COMMUNITY)
Admission: RE | Disposition: A | Discharge status: Home / Self Care | Payer: Self-pay | Source: Ambulatory Visit | Attending: Urology

## 2014-02-14 ENCOUNTER — Ambulatory Visit (HOSPITAL_COMMUNITY): Payer: Federal, State, Local not specified - PPO

## 2014-02-14 ENCOUNTER — Encounter (HOSPITAL_COMMUNITY): Payer: Self-pay | Admitting: *Deleted

## 2014-02-14 ENCOUNTER — Ambulatory Visit (HOSPITAL_COMMUNITY)
Admission: RE | Admit: 2014-02-14 | Discharge: 2014-02-14 | Disposition: A | Discharge status: Home / Self Care | Payer: Federal, State, Local not specified - PPO | Source: Ambulatory Visit | Attending: Urology | Admitting: Urology

## 2014-02-14 DIAGNOSIS — F17200 Nicotine dependence, unspecified, uncomplicated: Secondary | ICD-10-CM | POA: Insufficient documentation

## 2014-02-14 DIAGNOSIS — N201 Calculus of ureter: Secondary | ICD-10-CM | POA: Insufficient documentation

## 2014-02-14 DIAGNOSIS — J45909 Unspecified asthma, uncomplicated: Secondary | ICD-10-CM | POA: Insufficient documentation

## 2014-02-14 DIAGNOSIS — K219 Gastro-esophageal reflux disease without esophagitis: Secondary | ICD-10-CM | POA: Insufficient documentation

## 2014-02-14 DIAGNOSIS — N200 Calculus of kidney: Secondary | ICD-10-CM | POA: Diagnosis present

## 2014-02-14 HISTORY — DX: Other injury of unspecified body region, initial encounter: T14.8XXA

## 2014-02-14 SURGERY — LITHOTRIPSY, ESWL
Anesthesia: LOCAL | Laterality: Right

## 2014-02-14 MED ORDER — ACETAMINOPHEN 650 MG RE SUPP: 650.0000 mg | RECTAL | Status: DC | PRN

## 2014-02-14 MED ORDER — HYDROMORPHONE HCL PF 1 MG/ML IJ SOLN
1.0000 mg | INTRAMUSCULAR | Status: AC
  Administered 2014-02-14: 1 mg via INTRAVENOUS
  Filled 2014-02-14: qty 1

## 2014-02-14 MED ORDER — SODIUM CHLORIDE 0.9 % IJ SOLN: 3.0000 mL | INTRAMUSCULAR | Status: DC | PRN

## 2014-02-14 MED ORDER — ACETAMINOPHEN 325 MG PO TABS: 650.0000 mg | ORAL_TABLET | ORAL | Status: DC | PRN

## 2014-02-14 MED ORDER — SODIUM CHLORIDE 0.9 % IV SOLN: 250.0000 mL | INTRAVENOUS | Status: DC | PRN

## 2014-02-14 MED ORDER — ONDANSETRON HCL 4 MG/2ML IJ SOLN
4.0000 mg | Freq: Four times a day (QID) | INTRAMUSCULAR | Status: DC | PRN
  Administered 2014-02-14: 4 mg via INTRAVENOUS
  Filled 2014-02-14: qty 2

## 2014-02-14 MED ORDER — SODIUM CHLORIDE 0.9 % IJ SOLN: 3.0000 mL | Freq: Two times a day (BID) | INTRAMUSCULAR | Status: DC

## 2014-02-14 MED ORDER — DIPHENHYDRAMINE HCL 25 MG PO CAPS
25.0000 mg | ORAL_CAPSULE | ORAL | Status: AC
  Administered 2014-02-14: 25 mg via ORAL
  Filled 2014-02-14: qty 1

## 2014-02-14 MED ORDER — CIPROFLOXACIN HCL 500 MG PO TABS
500.0000 mg | ORAL_TABLET | ORAL | Status: AC
  Administered 2014-02-14: 500 mg via ORAL
  Filled 2014-02-14: qty 1

## 2014-02-14 MED ORDER — FENTANYL CITRATE 0.05 MG/ML IJ SOLN: 25.0000 ug | INTRAMUSCULAR | Status: DC | PRN

## 2014-02-14 MED ORDER — DEXTROSE-NACL 5-0.45 % IV SOLN
INTRAVENOUS | Status: DC
  Administered 2014-02-14: 14:00:00 via INTRAVENOUS

## 2014-02-14 MED ORDER — TAMSULOSIN HCL 0.4 MG PO CAPS: 0.4000 mg | ORAL_CAPSULE | Freq: Every day | ORAL | Status: DC

## 2014-02-14 MED ORDER — OXYCODONE HCL 5 MG PO TABS
5.0000 mg | ORAL_TABLET | ORAL | Status: DC | PRN
  Administered 2014-02-14: 10 mg via ORAL
  Filled 2014-02-14: qty 2

## 2014-02-14 MED ORDER — DIAZEPAM 5 MG PO TABS
10.0000 mg | ORAL_TABLET | ORAL | Status: AC
  Administered 2014-02-14: 10 mg via ORAL
  Filled 2014-02-14: qty 2

## 2014-02-14 MED ORDER — OXYCODONE-ACETAMINOPHEN 5-325 MG PO TABS: 1.0000 | ORAL_TABLET | ORAL | Status: DC | PRN

## 2014-02-14 NOTE — Discharge Instructions (Signed)
Lithotripsy, Care After °Refer to this sheet in the next few weeks. These instructions provide you with information on caring for yourself after your procedure. Your health care provider may also give you more specific instructions. Your treatment has been planned according to current medical practices, but problems sometimes occur. Call your health care provider if you have any problems or questions after your procedure. °WHAT TO EXPECT AFTER THE PROCEDURE  °· Your urine may have a red tinge for a few days after treatment. Blood loss is usually minimal. °· You may have soreness in the back or flank area. This usually goes away after a few days. The procedure can cause blotches or bruises on the back where the pressure wave enters the skin. These marks usually cause only minimal discomfort and should disappear in a short time. °· Stone fragments should begin to pass within 24 hours of treatment. However, a delayed passage is not unusual. °· You may have pain, discomfort, and feel sick to your stomach (nauseated) when the crushed fragments of stone are passed down the tube from the kidney to the bladder. Stone fragments can pass soon after the procedure and may last for up to 4 8 weeks. °· A small number of patients may have severe pain when stone fragments are not able to pass, which leads to an obstruction. °· If your stone is greater than 1 inch (2.5 cm) in diameter or if you have multiple stones that have a combined diameter greater than 1 inch (2.5 cm), you may require more than one treatment. °· If you had a stent placed prior to your procedure, you may experience some discomfort, especially during urination. You may experience the pain or discomfort in your flank or back, or you may experience a sharp pain or discomfort at the base of your penis or in your lower abdomen. The discomfort usually lasts only a few minutes after urinating. °HOME CARE INSTRUCTIONS  °· Rest at home until you feel your energy  improving. °· Only take over-the-counter or prescription medicines for pain, discomfort, or fever as directed by your health care provider. Depending on the type of lithotripsy, you may need to take antibiotics and anti-inflammatory medicines for a few days. °· Drink enough water and fluids to keep your urine clear or pale yellow. This helps "flush" your kidneys. It helps pass any remaining pieces of stone and prevents stones from coming back. °· Most people can resume daily activities within 1 2 days after standard lithotripsy. It can take longer to recover from laser and percutaneous lithotripsy. °· If the stones are in your urinary system, you may be asked to strain your urine at home to look for stones. Any stones that are found can be sent to a medical lab for examination. °· Visit your health care provider for a follow-up appointment in a few weeks. Your doctor may remove your stent if you have one. Your health care provider will also check to see whether stone particles still remain. °SEEK MEDICAL CARE IF:  °· Your pain is not relieved by medicine. °· You have a lasting nauseous feeling. °· You feel there is too much blood in the urine. °· You develop persistent problems with frequent or painful urination that does not at least partially improve after 2 days following the procedure. °· You have a congested cough. °· You feel lightheaded. °· You develop a rash or any other signs that might suggest an allergic problem. °· You develop any reaction or side   effects to your medicine(s). °SEEK IMMEDIATE MEDICAL CARE IF:  °· You experience severe back or flank pain or both. °· You see nothing but blood when you urinate. °· You cannot pass any urine at all. °· You have a fever or shaking chills. °· You develop shortness of breath, difficulty breathing, or chest pain. °· You develop vomiting that will not stop after 6 8 hours. °· You have a fainting episode. °Document Released: 01/02/2008 Document Revised: 10/03/2013  Document Reviewed: 06/28/2013 °[employer]® Patient Information ©2014 [employer]. ° °

## 2014-02-14 NOTE — Interval H&P Note (Signed)
History and Physical Interval Note:  02/14/2014 5:23 PM  Nathan Tanner  has presented today for surgery, with the diagnosis of RIGHT PROXIMAL AND RENAL STONES  The various methods of treatment have been discussed with the patient and family. After consideration of risks, benefits and other options for treatment, the patient has consented to  Procedure(s): RIGHT EXTRACORPOREAL SHOCK WAVE LITHOTRIPSY (ESWL) (Right) as a surgical intervention .  The patient's history has been reviewed, patient examined, no change in status, stable for surgery.  I have reviewed the patient's chart and labs.  Questions were answered to the patient's satisfaction.     [name] J

## 2014-02-14 NOTE — Progress Notes (Signed)
Patient stated pain 8/10. MD called and made aware. Order received.

## 2014-08-24 ENCOUNTER — Emergency Department (HOSPITAL_BASED_OUTPATIENT_CLINIC_OR_DEPARTMENT_OTHER)
Admission: EM | Admit: 2014-08-24 | Discharge: 2014-08-24 | Disposition: A | Discharge status: Home / Self Care | Payer: No Typology Code available for payment source | Attending: Emergency Medicine | Admitting: Emergency Medicine

## 2014-08-24 ENCOUNTER — Encounter (HOSPITAL_BASED_OUTPATIENT_CLINIC_OR_DEPARTMENT_OTHER): Payer: Self-pay | Admitting: Emergency Medicine

## 2014-08-24 DIAGNOSIS — J45909 Unspecified asthma, uncomplicated: Secondary | ICD-10-CM | POA: Insufficient documentation

## 2014-08-24 DIAGNOSIS — K0889 Other specified disorders of teeth and supporting structures: Secondary | ICD-10-CM

## 2014-08-24 DIAGNOSIS — Z87442 Personal history of urinary calculi: Secondary | ICD-10-CM | POA: Insufficient documentation

## 2014-08-24 DIAGNOSIS — R221 Localized swelling, mass and lump, neck: Secondary | ICD-10-CM

## 2014-08-24 DIAGNOSIS — K089 Disorder of teeth and supporting structures, unspecified: Secondary | ICD-10-CM | POA: Insufficient documentation

## 2014-08-24 DIAGNOSIS — IMO0002 Reserved for concepts with insufficient information to code with codable children: Secondary | ICD-10-CM | POA: Insufficient documentation

## 2014-08-24 DIAGNOSIS — F17200 Nicotine dependence, unspecified, uncomplicated: Secondary | ICD-10-CM | POA: Insufficient documentation

## 2014-08-24 DIAGNOSIS — Z79899 Other long term (current) drug therapy: Secondary | ICD-10-CM | POA: Insufficient documentation

## 2014-08-24 DIAGNOSIS — R220 Localized swelling, mass and lump, head: Secondary | ICD-10-CM | POA: Insufficient documentation

## 2014-08-24 MED ORDER — IBUPROFEN 800 MG PO TABS: 800.0000 mg | ORAL_TABLET | Freq: Three times a day (TID) | ORAL | Status: DC

## 2014-08-24 MED ORDER — CLINDAMYCIN HCL 150 MG PO CAPS: 150.0000 mg | ORAL_CAPSULE | Freq: Four times a day (QID) | ORAL | Status: DC

## 2014-08-24 NOTE — Discharge Instructions (Signed)

## 2014-08-24 NOTE — ED Provider Notes (Signed)
CSN: [id number]     Arrival date & time 08/24/14  1956 History   First MD Initiated Contact with Patient 08/24/14 2033     Chief Complaint  Patient presents with  . Dental Pain     (Consider location/radiation/quality/duration/timing/severity/associated sxs/prior Treatment) Patient is a 23 y.o. male presenting with tooth pain. The history is provided by the patient. No language interpreter was used.  Dental Pain Location:  Lower Lower teeth location:  31/RL 2nd molar Quality:  Localized and throbbing Severity:  Moderate Onset quality:  Gradual Duration:  1 week Timing:  Constant Progression:  Worsening Chronicity:  New Context: abscess   Previous work-up:  Dental exam Relieved by:  Nothing Ineffective treatments:  None tried Associated symptoms: facial swelling   Associated symptoms: no fever     Past Medical History  Diagnosis Date  . Kidney stones   . Asthma   . 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 Ureteral stent placement  2012   History reviewed. No pertinent family history. History  Substance Use Topics  . Smoking status: Light Tobacco Smoker -- 0.20 packs/day for 6 years    Types: Cigarettes  . Smokeless tobacco: Never Used  . Alcohol Use: No    Review of Systems  Constitutional: Negative for fever.  HENT: Positive for dental problem and facial swelling.   All other systems reviewed and are negative.     Allergies  Bee venom  Home Medications   Prior to Admission medications   Medication Sig Start Date End Date Taking? Authorizing Provider  albuterol (PROVENTIL HFA;VENTOLIN HFA) 108 (90 BASE) MCG/ACT inhaler Inhale 2 puffs into the lungs every 6 (six) hours as needed for wheezing or shortness of breath. For shortness of breath and wheezing    Historical Provider, MD  EPINEPHrine (EPI-PEN) 0.3 mg/0.3 mL DEVI Inject 0.3 mg into the muscle daily as  needed (Anaphylaxis). 11/01/12   Teressa Lower, NP  Fluticasone-Salmeterol (ADVAIR) 250-50 MCG/DOSE AEPB Inhale 1 puff into the lungs every 12 (twelve) hours as needed (Asthma).     Historical Provider, MD  ondansetron (ZOFRAN ODT) 4 MG disintegrating tablet Take 1 tablet (4 mg total) by mouth every 8 (eight) hours as needed for nausea or vomiting. 02/08/14   Emilia Beck, PA-C  oxyCODONE-acetaminophen (PERCOCET/ROXICET) 5-325 MG per tablet Take 1 tablet by mouth every 4 (four) hours as needed for moderate pain or severe pain. 02/14/14   Anner Crete, MD  tamsulosin (FLOMAX) 0.4 MG CAPS capsule Take 1 capsule (0.4 mg total) by mouth daily. 02/14/14   Anner Crete, MD   BP 109/66  Pulse 79  Temp(Src) 97.8 F (36.6 C) (Oral)  Resp 18  SpO2 97% Physical Exam  Nursing note and vitals reviewed. Constitutional: He is oriented to person, place, and time. He appears well-developed and well-nourished.  HENT:  Head: Normocephalic and atraumatic.  Swollen lower gumline  Eyes: EOM are normal. Pupils are equal, round, and reactive to light.  Neck: Normal range of motion.  Pulmonary/Chest: Effort normal.  Abdominal: He exhibits no distension.  Musculoskeletal: Normal range of motion.  Neurological: He is alert and oriented to person, place, and time.  Skin: Skin is warm.  Psychiatric: He has a normal mood and affect.    ED Course  Procedures (including critical care time) Labs Review Labs Reviewed - No data to display  Imaging Review No  results found.   EKG Interpretation None      MDM   Final diagnoses:  Toothache    Clindamycin Ibuprofen Detist referral   Elson Areas, PA-C 08/24/14 2103

## 2014-08-24 NOTE — ED Notes (Signed)
Pt reports right lower jaw pain decreased appetite swelling and pain to right lower jaw

## 2014-08-24 NOTE — ED Notes (Signed)
PT discharged to home with family. NAD. 

## 2014-08-25 NOTE — ED Provider Notes (Signed)
Medical screening examination/treatment/procedure(s) were performed by non-physician practitioner and as supervising physician I was immediately available for consultation/collaboration.   EKG Interpretation None        Gilda Crease, MD 08/25/14 [phone]

## 2015-02-13 ENCOUNTER — Emergency Department (HOSPITAL_BASED_OUTPATIENT_CLINIC_OR_DEPARTMENT_OTHER)
Admission: EM | Admit: 2015-02-13 | Discharge: 2015-02-14 | Disposition: A | Discharge status: Home / Self Care | Payer: Self-pay | Attending: Emergency Medicine | Admitting: Emergency Medicine

## 2015-02-13 ENCOUNTER — Encounter (HOSPITAL_BASED_OUTPATIENT_CLINIC_OR_DEPARTMENT_OTHER): Payer: Self-pay

## 2015-02-13 ENCOUNTER — Emergency Department (HOSPITAL_BASED_OUTPATIENT_CLINIC_OR_DEPARTMENT_OTHER): Payer: Federal, State, Local not specified - PPO

## 2015-02-13 DIAGNOSIS — K529 Noninfective gastroenteritis and colitis, unspecified: Secondary | ICD-10-CM | POA: Insufficient documentation

## 2015-02-13 DIAGNOSIS — Z720 Tobacco use: Secondary | ICD-10-CM | POA: Insufficient documentation

## 2015-02-13 DIAGNOSIS — R112 Nausea with vomiting, unspecified: Secondary | ICD-10-CM

## 2015-02-13 DIAGNOSIS — Z7951 Long term (current) use of inhaled steroids: Secondary | ICD-10-CM | POA: Insufficient documentation

## 2015-02-13 DIAGNOSIS — R197 Diarrhea, unspecified: Secondary | ICD-10-CM

## 2015-02-13 DIAGNOSIS — J45909 Unspecified asthma, uncomplicated: Secondary | ICD-10-CM | POA: Insufficient documentation

## 2015-02-13 DIAGNOSIS — Z87442 Personal history of urinary calculi: Secondary | ICD-10-CM | POA: Insufficient documentation

## 2015-02-13 DIAGNOSIS — Z87828 Personal history of other (healed) physical injury and trauma: Secondary | ICD-10-CM | POA: Insufficient documentation

## 2015-02-13 DIAGNOSIS — Z79899 Other long term (current) drug therapy: Secondary | ICD-10-CM | POA: Insufficient documentation

## 2015-02-13 LAB — URINALYSIS, ROUTINE W REFLEX MICROSCOPIC
Bilirubin Urine: NEGATIVE
GLUCOSE, UA: NEGATIVE mg/dL
KETONES UR: 15 mg/dL — AB
Nitrite: NEGATIVE
PH: 6 (ref 5.0–8.0)
Protein, ur: 30 mg/dL — AB
Specific Gravity, Urine: 1.035 — ABNORMAL HIGH (ref 1.005–1.030)
Urobilinogen, UA: 1 mg/dL (ref 0.0–1.0)

## 2015-02-13 LAB — CBC WITH DIFFERENTIAL/PLATELET
BASOS PCT: 0 % (ref 0–1)
Basophils Absolute: 0 10*3/uL (ref 0.0–0.1)
EOS PCT: 0 % (ref 0–5)
Eosinophils Absolute: 0 10*3/uL (ref 0.0–0.7)
HCT: 42.4 % (ref 39.0–52.0)
HEMOGLOBIN: 14.1 g/dL (ref 13.0–17.0)
LYMPHS ABS: 1.5 10*3/uL (ref 0.7–4.0)
Lymphocytes Relative: 12 % (ref 12–46)
MCH: 29.3 pg (ref 26.0–34.0)
MCHC: 33.3 g/dL (ref 30.0–36.0)
MCV: 88 fL (ref 78.0–100.0)
MONOS PCT: 14 % — AB (ref 3–12)
Monocytes Absolute: 1.8 10*3/uL — ABNORMAL HIGH (ref 0.1–1.0)
NEUTROS ABS: 9.6 10*3/uL — AB (ref 1.7–7.7)
Neutrophils Relative %: 74 % (ref 43–77)
Platelets: 285 10*3/uL (ref 150–400)
RBC: 4.82 MIL/uL (ref 4.22–5.81)
RDW: 12.7 % (ref 11.5–15.5)
WBC: 12.9 10*3/uL — AB (ref 4.0–10.5)

## 2015-02-13 LAB — COMPREHENSIVE METABOLIC PANEL
ALBUMIN: 5.1 g/dL (ref 3.5–5.2)
ALT: 25 U/L (ref 0–53)
ANION GAP: 7 (ref 5–15)
AST: 28 U/L (ref 0–37)
Alkaline Phosphatase: 94 U/L (ref 39–117)
BUN: 22 mg/dL (ref 6–23)
CO2: 27 mmol/L (ref 19–32)
CREATININE: 1.11 mg/dL (ref 0.50–1.35)
Calcium: 9.4 mg/dL (ref 8.4–10.5)
Chloride: 103 mmol/L (ref 96–112)
GFR calc Af Amer: >90 mL/min (ref >90)
GFR calc non Af Amer: >90 mL/min (ref >90)
GLUCOSE: 121 mg/dL — AB (ref 70–99)
POTASSIUM: 3.6 mmol/L (ref 3.5–5.1)
Sodium: 137 mmol/L (ref 135–145)
Total Bilirubin: 0.8 mg/dL (ref 0.3–1.2)
Total Protein: 9 g/dL — ABNORMAL HIGH (ref 6.0–8.3)

## 2015-02-13 LAB — URINE MICROSCOPIC-ADD ON

## 2015-02-13 LAB — LIPASE, BLOOD: Lipase: 28 U/L (ref 11–59)

## 2015-02-13 MED ORDER — SODIUM CHLORIDE 0.9 % IV BOLUS (SEPSIS)
1000.0000 mL | Freq: Once | INTRAVENOUS | Status: AC
  Administered 2015-02-13: 1000 mL via INTRAVENOUS

## 2015-02-13 MED ORDER — ONDANSETRON HCL 4 MG/2ML IJ SOLN
4.0000 mg | Freq: Once | INTRAMUSCULAR | Status: AC
  Administered 2015-02-13: 4 mg via INTRAVENOUS
  Filled 2015-02-13: qty 2

## 2015-02-13 MED ORDER — MORPHINE SULFATE 4 MG/ML IJ SOLN
4.0000 mg | Freq: Once | INTRAMUSCULAR | Status: AC
  Administered 2015-02-13: 4 mg via INTRAVENOUS
  Filled 2015-02-13: qty 1

## 2015-02-13 MED ORDER — SODIUM CHLORIDE 0.9 % IV BOLUS (SEPSIS): 1000.0000 mL | Freq: Once | INTRAVENOUS | Status: DC

## 2015-02-13 NOTE — ED Provider Notes (Signed)
CSN: [id number]     Arrival date & time 02/13/15  1645 History   First MD Initiated Contact with Patient 02/13/15 1710     Chief Complaint  Patient presents with  . Emesis     (Consider location/radiation/quality/duration/timing/severity/associated sxs/prior Treatment) HPI Comments: Patient presents with 2 day history of nausea, vomiting, diarrhea and abdominal pain. States started after he ate some expired [place] meat yesterday. Other family members got sick and he is feeling still sick. Denies fever. States he is vomited about 12 times since yesterday with multiple episodes of loose stool. No blood in emesis or diarrhea. Denies any chest pain or back pain. No shortness of breath. Complains of lower abdominal cramping pain. Denies any dysuria hematuria. No previous abdominal surgeries. History of kidney stone and ureteral stent.  The history is provided by the patient.    Past Medical History  Diagnosis Date  . Kidney stones   . Asthma   . 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 Ureteral stent placement  2012   No family history on file. History  Substance Use Topics  . Smoking status: Current Every Day Smoker -- 0.20 packs/day for 6 years    Types: Cigarettes  . Smokeless tobacco: Never Used  . Alcohol Use: No    Review of Systems  Constitutional: Positive for activity change, appetite change and fatigue. Negative for fever.  HENT: Negative for congestion and rhinorrhea.   Respiratory: Negative for cough, chest tightness and shortness of breath.   Cardiovascular: Negative for chest pain.  Gastrointestinal: Positive for nausea, vomiting, abdominal pain and diarrhea.  Genitourinary: Negative for dysuria, urgency and hematuria.  Musculoskeletal: Negative for myalgias and arthralgias.  Skin: Negative for rash.  Neurological: Negative for dizziness, weakness and headaches.  A  complete 10 system review of systems was obtained and all systems are negative except as noted in the HPI and PMH.      Allergies  Bee venom  Home Medications   Prior to Admission medications   Medication Sig Start Date End Date Taking? Authorizing Provider  albuterol (PROVENTIL HFA;VENTOLIN HFA) 108 (90 BASE) MCG/ACT inhaler Inhale 2 puffs into the lungs every 6 (six) hours as needed for wheezing or shortness of breath. For shortness of breath and wheezing    Historical Provider, MD  EPINEPHrine (EPI-PEN) 0.3 mg/0.3 mL DEVI Inject 0.3 mg into the muscle daily as needed (Anaphylaxis). 11/01/12   Teressa Lower, NP  Fluticasone-Salmeterol (ADVAIR) 250-50 MCG/DOSE AEPB Inhale 1 puff into the lungs every 12 (twelve) hours as needed (Asthma).     Historical Provider, MD  ondansetron (ZOFRAN) 4 MG tablet Take 1 tablet (4 mg total) by mouth every 6 (six) hours. 02/14/15   Glynn Octave, MD   BP 102/55 mmHg  Pulse 58  Temp(Src) 98.1 F (36.7 C) (Oral)  Resp 18  Ht  (1.803 m)  Wt 140 lb (63.504 kg)  BMI 19.53 kg/m2  SpO2 98% Physical Exam  Constitutional: He is oriented to person, place, and time. He appears well-developed and well-nourished. No distress.  HENT:  Head: Normocephalic and atraumatic.  Mouth/Throat: Oropharynx is clear and moist. No oropharyngeal exudate.  Dry mucus membranes  Eyes: Conjunctivae and EOM are normal. Pupils are equal, round, and reactive to light.  Neck: Normal range of motion. Neck supple.  No meningismus.  Cardiovascular: Normal rate, regular rhythm, normal heart sounds and  intact distal pulses.   No murmur heard. Pulmonary/Chest: Effort normal and breath sounds normal. No respiratory distress.  Abdominal: Soft. There is tenderness. There is no rebound and no guarding.  Right lower quadrant superpubic tenderness  Musculoskeletal: Normal range of motion. He exhibits no edema or tenderness.  Neurological: He is alert and oriented to person, place,  and time. No cranial nerve deficit. He exhibits normal muscle tone. Coordination normal.  No ataxia on finger to nose bilaterally. No pronator drift. 5/5 strength throughout. CN 2-12 intact. Negative Romberg. Equal grip strength. Sensation intact. Gait is normal.   Skin: Skin is warm.  Psychiatric: He has a normal mood and affect. His behavior is normal.  Nursing note and vitals reviewed.   ED Course  Procedures (including critical care time) Labs Review Labs Reviewed  CBC WITH DIFFERENTIAL/PLATELET - Abnormal; Notable for the following:    WBC 12.9 (*)    Neutro Abs 9.6 (*)    Monocytes Relative 14 (*)    Monocytes Absolute 1.8 (*)    All other components within normal limits  COMPREHENSIVE METABOLIC PANEL - Abnormal; Notable for the following:    Glucose, Bld 121 (*)    Total Protein 9.0 (*)    All other components within normal limits  URINALYSIS, ROUTINE W REFLEX MICROSCOPIC - Abnormal; Notable for the following:    Color, Urine AMBER (*)    Specific Gravity, Urine 1.035 (*)    Hgb urine dipstick TRACE (*)    Ketones, ur 15 (*)    Protein, ur 30 (*)    Leukocytes, UA SMALL (*)    All other components within normal limits  URINE MICROSCOPIC-ADD ON - Abnormal; Notable for the following:    Squamous Epithelial / LPF FEW (*)    Bacteria, UA FEW (*)    All other components within normal limits  URINE CULTURE  LIPASE, BLOOD    Imaging Review [place] Abdomen Complete  02/13/2015   CLINICAL DATA:  Right lower quadrant pain with nausea, vomiting and diarrhea for 22 hr.  EXAM: ULTRASOUND ABDOMEN COMPLETE  COMPARISON:  CT abdomen and pelvis 02/08/2014.  FINDINGS: Gallbladder: No gallstones or wall thickening visualized. No sonographic Murphy sign noted.  Common bile duct: Diameter: 0.1 cm  Liver: No focal lesion identified. Within normal limits in parenchymal echogenicity.  IVC: No abnormality visualized.  Pancreas: Visualized portion unremarkable.  Spleen: Size and appearance within  normal limits.  Right Kidney: Length: 11.4 cm. Echogenicity within normal limits. No mass or hydronephrosis visualized.  Left Kidney: Length: 11.4 cm. Echogenicity within normal limits. No mass or hydronephrosis visualized.  Abdominal aorta: No aneurysm visualized.  Other findings: Scanning directed toward the region of concern demonstrates no abnormality.  IMPRESSION: Negative exam.   Electronically Signed   By: Drusilla Kannerhomas  Dalessio M.D.   On: 02/13/2015 23:58     EKG Interpretation None      MDM   Final diagnoses:  Gastroenteritis  nausea, vomiting, diarrhea, abdominal pain after eating expired food. Vital stable. Abdomen tender in the lower quadrants. No peritoneal signs.  IV fluids, antiemetics White blood cell count 12.9. Urinalysis equivocal and culture will be sent.  Patient appears improved. He is tolerating by mouth.  He has some residual right sided lower abdominal tenderness and periumbilical tenderness. Patient is aware that appendicitis has not been able to be ruled out. Seems less likely given the time course of his illness and other people with same illness. He understands he should return with worsening right  lower quadrant pain, vomiting or fever. He declines transfer for CT scan tonight. Return precautions discussed.       Glynn Octave, MD 02/14/15 [phone]

## 2015-02-13 NOTE — ED Notes (Signed)
Pt reports he ate expired sandwich meat yesterday and has had n/v/d since-

## 2015-02-13 NOTE — ED Notes (Signed)
N/v/d x 2 days-states he ate expired meat yesterday

## 2015-02-13 NOTE — ED Notes (Signed)
Pt drinking gingerale for oral fluid challenge- aware of need for urine sample

## 2015-02-13 NOTE — ED Notes (Signed)
MD at bedside. 

## 2015-02-14 MED ORDER — ONDANSETRON HCL 4 MG PO TABS: 4.0000 mg | ORAL_TABLET | Freq: Four times a day (QID) | ORAL | Status: DC

## 2015-02-14 NOTE — Discharge Instructions (Signed)
Viral Gastroenteritis As discussed, appendicitis is not completely ruled out. Take the medications as prescribed. Return to the ED with worsening pain, fever, vomiting or any other concerns. Viral gastroenteritis is also known as stomach flu. This condition affects the stomach and intestinal tract. It can cause sudden diarrhea and vomiting. The illness typically lasts 3 to 8 days. Most people develop an immune response that eventually gets rid of the virus. While this natural response develops, the virus can make you quite ill. CAUSES  Many different viruses can cause gastroenteritis, such as rotavirus or noroviruses. You can catch one of these viruses by consuming contaminated food or water. You may also catch a virus by sharing utensils or other personal items with an infected person or by touching a contaminated surface. SYMPTOMS  The most common symptoms are diarrhea and vomiting. These problems can cause a severe loss of body fluids (dehydration) and a body salt (electrolyte) imbalance. Other symptoms may include:  Fever.  Headache.  Fatigue.  Abdominal pain. DIAGNOSIS  Your caregiver can usually diagnose viral gastroenteritis based on your symptoms and a physical exam. A stool sample may also be taken to test for the presence of viruses or other infections. TREATMENT  This illness typically goes away on its own. Treatments are aimed at rehydration. The most serious cases of viral gastroenteritis involve vomiting so severely that you are not able to keep fluids down. In these cases, fluids must be given through an intravenous line (IV). HOME CARE INSTRUCTIONS   Drink enough fluids to keep your urine clear or pale yellow. Drink small amounts of fluids frequently and increase the amounts as tolerated.  Ask your caregiver for specific rehydration instructions.  Avoid:  Foods high in sugar.  Alcohol.  Carbonated drinks.  Tobacco.  Juice.  Caffeine drinks.  Extremely hot or  cold fluids.  Fatty, greasy foods.  Too much intake of anything at one time.  Dairy products until 24 to 48 hours after diarrhea stops.  You may consume probiotics. Probiotics are active cultures of beneficial bacteria. They may lessen the amount and number of diarrheal stools in adults. Probiotics can be found in yogurt with active cultures and in supplements.  Wash your hands well to avoid spreading the virus.  Only take over-the-counter or prescription medicines for pain, discomfort, or fever as directed by your caregiver. Do not give aspirin to children. Antidiarrheal medicines are not recommended.  Ask your caregiver if you should continue to take your regular prescribed and over-the-counter medicines.  Keep all follow-up appointments as directed by your caregiver. SEEK IMMEDIATE MEDICAL CARE IF:   You are unable to keep fluids down.  You do not urinate at least once every 6 to 8 hours.  You develop shortness of breath.  You notice blood in your stool or vomit. This may look like coffee grounds.  You have abdominal pain that increases or is concentrated in one small area (localized).  You have persistent vomiting or diarrhea.  You have a fever.  The patient is a child younger than 3 months, and he or she has a fever.  The patient is a child older than 3 months, and he or she has a fever and persistent symptoms.  The patient is a child older than 3 months, and he or she has a fever and symptoms suddenly get worse.  The patient is a baby, and he or she has no tears when crying. MAKE SURE YOU:   Understand these instructions.  Will watch  your condition.  Will get help right away if you are not doing well or get worse. Document Released: 12/13/2005 Document Revised: 03/06/2012 Document Reviewed: 09/29/2011 [facility] Patient Information 2015 [place], [place]. This information is not intended to replace advice given to you by your health care provider. Make sure you  discuss any questions you have with your health care provider.

## 2015-02-15 LAB — URINE CULTURE
COLONY COUNT: NO GROWTH
CULTURE: NO GROWTH

## 2015-03-20 ENCOUNTER — Emergency Department (HOSPITAL_BASED_OUTPATIENT_CLINIC_OR_DEPARTMENT_OTHER): Payer: Federal, State, Local not specified - PPO

## 2015-03-20 ENCOUNTER — Emergency Department (HOSPITAL_BASED_OUTPATIENT_CLINIC_OR_DEPARTMENT_OTHER)
Admission: EM | Admit: 2015-03-20 | Discharge: 2015-03-20 | Disposition: A | Discharge status: Home / Self Care | Payer: Self-pay | Attending: Emergency Medicine | Admitting: Emergency Medicine

## 2015-03-20 ENCOUNTER — Encounter (HOSPITAL_BASED_OUTPATIENT_CLINIC_OR_DEPARTMENT_OTHER): Payer: Self-pay

## 2015-03-20 DIAGNOSIS — N200 Calculus of kidney: Secondary | ICD-10-CM | POA: Insufficient documentation

## 2015-03-20 DIAGNOSIS — Z79899 Other long term (current) drug therapy: Secondary | ICD-10-CM | POA: Insufficient documentation

## 2015-03-20 DIAGNOSIS — R52 Pain, unspecified: Secondary | ICD-10-CM

## 2015-03-20 DIAGNOSIS — J45909 Unspecified asthma, uncomplicated: Secondary | ICD-10-CM | POA: Insufficient documentation

## 2015-03-20 DIAGNOSIS — Z87828 Personal history of other (healed) physical injury and trauma: Secondary | ICD-10-CM | POA: Insufficient documentation

## 2015-03-20 DIAGNOSIS — N390 Urinary tract infection, site not specified: Secondary | ICD-10-CM | POA: Insufficient documentation

## 2015-03-20 DIAGNOSIS — Z720 Tobacco use: Secondary | ICD-10-CM | POA: Insufficient documentation

## 2015-03-20 LAB — CBC WITH DIFFERENTIAL/PLATELET
BASOS PCT: 0 % (ref 0–1)
Basophils Absolute: 0 10*3/uL (ref 0.0–0.1)
EOS ABS: 0.2 10*3/uL (ref 0.0–0.7)
EOS PCT: 2 % (ref 0–5)
HCT: 41.1 % (ref 39.0–52.0)
Hemoglobin: 13.3 g/dL (ref 13.0–17.0)
LYMPHS ABS: 5.6 10*3/uL — AB (ref 0.7–4.0)
LYMPHS PCT: 67 % — AB (ref 12–46)
MCH: 29 pg (ref 26.0–34.0)
MCHC: 32.4 g/dL (ref 30.0–36.0)
MCV: 89.7 fL (ref 78.0–100.0)
MONOS PCT: 8 % (ref 3–12)
Monocytes Absolute: 0.7 10*3/uL (ref 0.1–1.0)
NEUTROS PCT: 23 % — AB (ref 43–77)
Neutro Abs: 2 10*3/uL (ref 1.7–7.7)
Platelets: 246 10*3/uL (ref 150–400)
RBC: 4.58 MIL/uL (ref 4.22–5.81)
RDW: 12.3 % (ref 11.5–15.5)
WBC: 8.4 10*3/uL (ref 4.0–10.5)

## 2015-03-20 LAB — URINALYSIS, ROUTINE W REFLEX MICROSCOPIC
Bilirubin Urine: NEGATIVE
Glucose, UA: NEGATIVE mg/dL
Ketones, ur: NEGATIVE mg/dL
NITRITE: NEGATIVE
Protein, ur: NEGATIVE mg/dL
SPECIFIC GRAVITY, URINE: 1.019 (ref 1.005–1.030)
UROBILINOGEN UA: 0.2 mg/dL (ref 0.0–1.0)
pH: 6 (ref 5.0–8.0)

## 2015-03-20 LAB — URINE MICROSCOPIC-ADD ON

## 2015-03-20 LAB — BASIC METABOLIC PANEL
ANION GAP: 8 (ref 5–15)
BUN: 14 mg/dL (ref 6–23)
CHLORIDE: 105 mmol/L (ref 96–112)
CO2: 29 mmol/L (ref 19–32)
CREATININE: 1.09 mg/dL (ref 0.50–1.35)
Calcium: 9.4 mg/dL (ref 8.4–10.5)
GFR calc non Af Amer: >90 mL/min (ref >90)
Glucose, Bld: 101 mg/dL — ABNORMAL HIGH (ref 70–99)
POTASSIUM: 3.2 mmol/L — AB (ref 3.5–5.1)
SODIUM: 142 mmol/L (ref 135–145)

## 2015-03-20 MED ORDER — OXYCODONE-ACETAMINOPHEN 5-325 MG PO TABS: 1.0000 | ORAL_TABLET | Freq: Four times a day (QID) | ORAL | Status: DC | PRN

## 2015-03-20 MED ORDER — ONDANSETRON HCL 4 MG/2ML IJ SOLN
4.0000 mg | Freq: Once | INTRAMUSCULAR | Status: AC
  Administered 2015-03-20: 4 mg via INTRAVENOUS
  Filled 2015-03-20: qty 2

## 2015-03-20 MED ORDER — ONDANSETRON HCL 4 MG/2ML IJ SOLN
INTRAMUSCULAR | Status: AC
  Filled 2015-03-20: qty 2

## 2015-03-20 MED ORDER — CEFAZOLIN SODIUM 1 G IJ SOLR
INTRAMUSCULAR | Status: AC
  Administered 2015-03-20: 1000 mg
  Filled 2015-03-20: qty 10

## 2015-03-20 MED ORDER — ONDANSETRON HCL 4 MG/2ML IJ SOLN
4.0000 mg | Freq: Once | INTRAMUSCULAR | Status: AC
  Administered 2015-03-20: 4 mg via INTRAVENOUS

## 2015-03-20 MED ORDER — KETOROLAC TROMETHAMINE 30 MG/ML IJ SOLN
30.0000 mg | Freq: Once | INTRAMUSCULAR | Status: AC
  Administered 2015-03-20: 30 mg via INTRAVENOUS
  Filled 2015-03-20: qty 1

## 2015-03-20 MED ORDER — DICYCLOMINE HCL 10 MG/ML IM SOLN
20.0000 mg | Freq: Once | INTRAMUSCULAR | Status: AC
  Administered 2015-03-20: 20 mg via INTRAMUSCULAR
  Filled 2015-03-20: qty 2

## 2015-03-20 MED ORDER — OXYCODONE-ACETAMINOPHEN 5-325 MG PO TABS
1.0000 | ORAL_TABLET | Freq: Once | ORAL | Status: AC
  Administered 2015-03-20: 1 via ORAL
  Filled 2015-03-20: qty 1

## 2015-03-20 MED ORDER — DEXTROSE 5 % IV SOLN: 1.0000 g | Freq: Once | INTRAVENOUS | Status: AC

## 2015-03-20 MED ORDER — TAMSULOSIN HCL 0.4 MG PO CAPS
0.4000 mg | ORAL_CAPSULE | Freq: Every day | ORAL | Status: DC
  Administered 2015-03-20: 0.4 mg via ORAL
  Filled 2015-03-20: qty 1

## 2015-03-20 MED ORDER — PROMETHAZINE HCL 25 MG/ML IJ SOLN
25.0000 mg | Freq: Once | INTRAMUSCULAR | Status: AC
  Administered 2015-03-20: 25 mg via INTRAVENOUS
  Filled 2015-03-20: qty 1

## 2015-03-20 MED ORDER — CEPHALEXIN 500 MG PO CAPS: 500.0000 mg | ORAL_CAPSULE | Freq: Four times a day (QID) | ORAL | Status: DC

## 2015-03-20 MED ORDER — MORPHINE SULFATE 4 MG/ML IJ SOLN
4.0000 mg | Freq: Once | INTRAMUSCULAR | Status: AC
  Administered 2015-03-20: 4 mg via INTRAVENOUS
  Filled 2015-03-20: qty 1

## 2015-03-20 MED ORDER — TAMSULOSIN HCL 0.4 MG PO CAPS: 0.4000 mg | ORAL_CAPSULE | Freq: Every day | ORAL | Status: DC

## 2015-03-20 NOTE — ED Provider Notes (Signed)
CSN: [id number]     Arrival date & time 03/20/15  0148 History   First MD Initiated Contact with Patient 03/20/15 0203     Chief Complaint  Patient presents with  . Abdominal Pain     (Consider location/radiation/quality/duration/timing/severity/associated sxs/prior Treatment) Patient is a 24 y.o. male presenting with abdominal pain. The history is provided by the patient.  Abdominal Pain Pain location:  Suprapubic Pain quality: stabbing   Pain radiates to:  Does not radiate Pain severity:  Severe Onset quality:  Sudden Timing:  Constant Progression:  Unchanged Chronicity:  Recurrent Context: not alcohol use   Relieved by:  Nothing Worsened by:  Nothing tried Ineffective treatments:  None tried Associated symptoms: diarrhea and vomiting   Associated symptoms: no anorexia and no shortness of breath   Risk factors: has not had multiple surgeries     Past Medical History  Diagnosis Date  . Kidney stones   . Asthma   . 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 Ureteral stent placement  2012   No family history on file. History  Substance Use Topics  . Smoking status: Current Every Day Smoker -- 0.20 packs/day for 6 years    Types: Cigarettes  . Smokeless tobacco: Never Used  . Alcohol Use: No    Review of Systems  Respiratory: Negative for shortness of breath.   Gastrointestinal: Positive for vomiting, abdominal pain and diarrhea. Negative for anorexia.  All other systems reviewed and are negative.     Allergies  Bee venom  Home Medications   Prior to Admission medications   Medication Sig Start Date End Date Taking? Authorizing Provider  albuterol (PROVENTIL HFA;VENTOLIN HFA) 108 (90 BASE) MCG/ACT inhaler Inhale 2 puffs into the lungs every 6 (six) hours as needed for wheezing or shortness of breath. For shortness of breath and wheezing    Historical Provider, MD   EPINEPHrine (EPI-PEN) 0.3 mg/0.3 mL DEVI Inject 0.3 mg into the muscle daily as needed (Anaphylaxis). 11/01/12   Teressa Lower, NP  Fluticasone-Salmeterol (ADVAIR) 250-50 MCG/DOSE AEPB Inhale 1 puff into the lungs every 12 (twelve) hours as needed (Asthma).     Historical Provider, MD  ondansetron (ZOFRAN) 4 MG tablet Take 1 tablet (4 mg total) by mouth every 6 (six) hours. 02/14/15   Glynn Octave, MD   BP 119/79 mmHg  Temp(Src) 97.7 F (36.5 C) (Oral)  Resp 20  Ht  (1.803 m)  Wt 145 lb (65.772 kg)  BMI 20.23 kg/m2  SpO2 100% Physical Exam  Constitutional: He is oriented to person, place, and time. He appears well-developed and well-nourished. No distress.  HENT:  Head: Normocephalic and atraumatic.  Mouth/Throat: Oropharynx is clear and moist.  Eyes: Conjunctivae are normal. Pupils are equal, round, and reactive to light.  Neck: Normal range of motion. Neck supple.  Cardiovascular: Normal rate and regular rhythm.   Pulmonary/Chest: Effort normal and breath sounds normal. No respiratory distress. He has no wheezes. He has no rales.  Abdominal: Soft. Bowel sounds are normal. There is no tenderness. There is no rebound and no guarding.  Musculoskeletal: Normal range of motion.  Neurological: He is alert and oriented to person, place, and time.  Skin: Skin is warm and dry.  Psychiatric: He has a normal mood and affect.    ED Course  Procedures (including critical care time) Labs Review Labs Reviewed  CBC WITH  DIFFERENTIAL/PLATELET - Abnormal; Notable for the following:    Neutrophils Relative % 23 (*)    Lymphocytes Relative 67 (*)    Lymphs Abs 5.6 (*)    All other components within normal limits  BASIC METABOLIC PANEL - Abnormal; Notable for the following:    Potassium 3.2 (*)    Glucose, Bld 101 (*)    All other components within normal limits  URINALYSIS, ROUTINE W REFLEX MICROSCOPIC - Abnormal; Notable for the following:    APPearance CLOUDY (*)    Hgb urine  dipstick LARGE (*)    Leukocytes, UA SMALL (*)    All other components within normal limits  URINE MICROSCOPIC-ADD ON - Abnormal; Notable for the following:    Bacteria, UA MANY (*)    Crystals CA OXALATE CRYSTALS (*)    All other components within normal limits    Imaging Review No results found.   EKG Interpretation None      MDM   Final diagnoses:  Pain    Will treat for UTI, stone and have informed patient of need to follow up with a PMD for parathyroid work up as this has been uggested on multiple CT scans.  Have given resource guide for referral to community physician.  No driving or ETOH while taking pain medication.     Cy BlamerApril Vika Buske, MD 03/20/15 [phone]

## 2015-03-20 NOTE — ED Notes (Signed)
Patient transported to X-ray 

## 2015-03-20 NOTE — Discharge Instructions (Signed)
Antibiotic Medication Antibiotics are among the most frequently prescribed medicines. Antibiotics cure illness by assisting our body to injure or kill the bacteria that cause infection. While antibiotics are useful to treat a wide variety of infections they are useless against viruses. Antibiotics cannot cure colds, flu, or other viral infections.  There are many types of antibiotics available. Your caregiver will decide which antibiotic will be useful for an illness. Never take or give someone else's antibiotics or left over medicine. Your caregiver may also take into account:  Allergies.  The cost of the medicine.  Dosing schedules.  Taste.  Common side effects when choosing an antibiotic for an infection. Ask your caregiver if you have questions about why a certain medicine was chosen. HOME CARE INSTRUCTIONS Read all instructions and labels on medicine bottles carefully. Some antibiotics should be taken on an empty stomach while others should be taken with food. Taking antibiotics incorrectly may reduce how well they work. Some antibiotics need to be kept in the refrigerator. Others should be kept at room temperature. Ask your caregiver or pharmacist if you do not understand how to give the medicine. Be sure to give the amount of medicine your caregiver has prescribed. Even if you feel better and your symptoms improve, bacteria may still remain alive in the body. Taking all of the medicine will prevent:  The infection from returning and becoming harder to treat.  Complications from partially treated infections. If there is any medicine left over after you have taken the medicine as your caregiver has instructed, throw the medicine away. Be sure to tell your caregiver if you:  Are allergic to any medicines.  Are pregnant or intend to become pregnant while using this medicine.  Are breastfeeding.  Are taking any other prescription, non-prescription medicine, or herbal  remedies.  Have any other medical conditions or problems you have not already discussed. If you are taking birth control pills, they may not work while you are on antibiotics. To avoid unwanted pregnancy:  Continue taking your birth control pills as usual.  Use a second form of birth control (such as condoms) while you are taking antibiotic medicine.  When you finish taking the antibiotic medicine, continue using the second form of birth control until you are finished with your current 1 month cycle of birth control pills. Try not to miss any doses of medicine. If you miss a dose, take it as soon as possible. However, if it is almost time for the next dose and the dosing schedule is:  2 doses a day, take the missed dose and the next dose 5 to 6 hours apart.  3 or more doses a day, take the missed dose and the next dose 2 to 4 hours apart, then go back to the normal schedule.  If you are unable to make up a missed dose, take the next scheduled dose on time and complete the missed dose at the end of the prescribed time for your medicine. SIDE EFFECTS TO TAKING ANTIBIOTICS Common side effects to antibiotic use include:  Soft stools or diarrhea.  Mild stomach upset.  Sun sensitivity. SEEK MEDICAL CARE IF:   If you get worse or do not improve within a few days of starting the medicine.  Vomiting develops.  Diaper rash or rash on the genitals appears.  Vaginal itching occurs.  White patches appear on the tongue or in the mouth.  Severe watery diarrhea and abdominal cramps occur.  Signs of an allergy develop (hives, unknown  itchy rash appears). STOP TAKING THE ANTIBIOTIC. SEEK IMMEDIATE MEDICAL CARE IF:   Urine turns dark or blood colored.  Skin turns yellow.  Easy bruising or bleeding occurs.  Joint pain or muscle aches occur.  Fever returns.  Severe headache occurs.  Signs of an allergy develop (trouble breathing, wheezing, swelling of the lips, face or tongue,  fainting, or blisters on the skin or in the mouth). STOP TAKING THE ANTIBIOTIC. Document Released: 08/25/2004 Document Revised: 03/06/2012 Document Reviewed: 09/04/2009 [facility] Patient Information 2015 [place], [place]. This information is not intended to replace advice given to you by your health care provider. Make sure you discuss any questions you have with your health care provider.  Emergency Department Resource Guide 1) Find a Doctor and Pay Out of Pocket Although you won't have to find out who is covered by your insurance plan, it is a good idea to ask around and get recommendations. You will then need to call the office and see if the doctor you have chosen will accept you as a new patient and what types of options they offer for patients who are self-pay. Some doctors offer discounts or will set up payment plans for their patients who do not have insurance, but you will need to ask so you aren't surprised when you get to your appointment.  2) Contact Your Local Health Department Not all health departments have doctors that can see patients for sick visits, but many do, so it is worth a call to see if yours does. If you don't know where your local health department is, you can check in your phone book. The CDC also has a tool to help you locate your state's health department, and many state websites also have listings of all of their local health departments.  3) Find a Walk-in Clinic If your illness is not likely to be very severe or complicated, you may want to try a walk in clinic. These are popping up all over the country in pharmacies, drugstores, and shopping centers. They're usually staffed by nurse practitioners or physician assistants that have been trained to treat common illnesses and complaints. They're usually fairly quick and inexpensive. However, if you have serious medical issues or chronic medical problems, these are probably not your best option.  No Primary Care  Doctor: - Call Health Connect at  [phone] - they can help you locate a primary care doctor that  accepts your insurance, provides certain services, etc. - Physician Referral Service- [phone]  Chronic Pain Problems: Organization         Address  Phone   Notes  [facility]  [phone] Patients need to be referred by their primary care doctor.   Medication Assistance: Organization         Address  Phone   Notes  [facility] Medication [facility] [address] [place] [place], [place] [address] [phone] --Must be a resident of [facility] -- Must have NO insurance coverage whatsoever (no Medicaid/ Medicare, etc.) -- The pt. MUST have a primary care doctor that directs their care regularly and follows them in the community   [facility]  [phone]   [employer]  [phone]    Agencies that provide inexpensive medical care: Organization         Address  Phone   Notes  [facility]  [phone]   [facility]    [phone]   [facility] Outpatient  Clinic [address] [place], [place] [address] [phone]) [phone]   [facility] [address], [place] [phone]) [phone]   [facility]anned Parenthood    [phone]   [facility]    [phone]   [facility]  [address], [place] Phone:  [phone](336) [phone], Fax:  [phone] Hours of Operation:  9 am - 6 pm, M-F.  Also accepts Medicaid/Medicare and self-pay.  [facility]  [address], [place] Phone: [phone], Fax: [phone]. Hours of Operation:  8:30 am - 5:30 pm, M-F.  Also accepts Medicaid and self-pay.  [facility] [place] [address], [place] Phone: [phone]) [phone]   [facility] [address], [place], [place] [phone], Ext. [phone] Mondays & Thursdays: 7-9 AM.  First 15 patients are seen on a first  come, first serve basis.    Medicaid-accepting [facility] Providers:  Organization         Address  Phone   Notes  [facility] [address], [place] [phone]) [phone] Also accepts self-pay patients.  [facility] [address], [place]  [phone]) [phone]   [facility] [address], [place] [phone]) [phone]   [facility] [address] [place] [address], [place] [phone]) [phone]   Renaye RakersVeita Bland [address], [place]   [phone]) [phone] Only accepts [place] Access [place] patients after they have their name applied to their card.   Self-Pay (no insurance) in [facility]:  Organization         Address  Phone   Notes  Sickle Cell Patients, [facility] [address], [place] [phone]) [phone]   [facility] [address], [place] [phone]) [phone]   [facility]  [address], [place] [phone]) [phone]   [facility]/Dr. Osei-Bonsu  [address] [place] [address], [place] or [phone], [place] [phone](336) [phone] Phone number for both [place] and [place] locations is the same.  [facility] [address], [place] [phone]) [phone]   [facility] [place] [address] [place] [address], [place] or [address] [phone]) [phone]   [facility] [address], [place] [phone](336) [phone], phone; [phone], fax Sees patients 1st and 3rd Saturday of every month.  Must not qualify for public or private insurance (i.e. Medicaid, Medicare, [place] Health Choice, Veterans' Benefits)  Household income should be no more than 200% of the poverty level The clinic cannot treat you if you are pregnant or think you are pregnant  Sexually transmitted diseases are not treated at the clinic.    Dental Care: Organization          Address  Phone  Notes  [facility] of [facility] [address], [place] [phone]) [phone] Accepts children up to age 26 who are enrolled in [place] or [place] Health Choice; pregnant women with a Medicaid card; and children who have applied for Medicaid or [place] Health Choice, but were declined, whose parents can pay a reduced fee at time of service.  [facility] Department of [facility] [place]  [address], [place] [phone]) [phone] Accepts children up to age 721 who are enrolled in  Medicaid or [place] Health Choice; pregnant women with a Medicaid card; and children who have applied for Medicaid or [place] Health Choice, but were declined, whose parents can pay a reduced fee at time of service.  [facility]  [address] [place], [place] [phone] Patients are seen by appointment only. Walk-ins are not accepted. [facility] will see patients 60 years of age and older. Monday - Tuesday (8am-5pm) Most Wednesdays (8:30-5pm) $30 per visit, cash only  [facility]  [address], [facility] [phone] Patients are seen by appointment only. Walk-ins are not accepted. [facility] will see patients 32 years of age and older. One Wednesday Evening (Monthly: Volunteer Based).  $30 per visit, cash only  [employer]  [phone] for adults; Children under age 40, call Graduate Pediatric Dentistry at [phone]. Children aged 42-14, please call [phone] to request a pediatric application.  Dental services are provided in all areas of dental care including fillings, crowns and bridges, complete and partial dentures, implants, gum treatment, root canals, and extractions. Preventive care is also provided. Treatment is provided to both adults and children. Patients are selected via a lottery and there is often a waiting list.   [facility] [address], [place]  [phone] [email or web address]   [facility] [address] [place], [place] [phone], Ext. [phone] Second and Fourth Thursday of each month, opens at 6:30 AM; Clinic ends at 9 AM.  Patients are seen on a first-come first-served basis, and a limited number are seen during each clinic.   [facility]  [address] [place], [place] [phone]   Eligibility Requirements You must have lived in [place], [place], or [place] counties for at least the last three months.   You cannot be eligible for state or federal sponsored [place], including [employer], [place], or [employer].   You generally cannot be eligible for healthcare insurance through your employer.    How to apply: Eligibility screenings are held every Tuesday and Wednesday afternoon from 1:00 pm until 4:00 pm. You do not need an appointment for the interview!  [facility] [address] [place], [place] [phone]   [facility] Health Department  [phone]   [facility]  [phone]   [facility] Health Department  [phone]    Behavioral Health Resources in the Community: Intensive Outpatient Programs Organization         Address  Phone  Notes  [facility] [address], [place], [place] [phone]   [facility] Outpatient [address] [place], [place] [phone]   [facility] [address] [place], [place]  [phone]   [facility] Mental Health [address],  [place], [place] [phone] or [phone]   Substance Abuse Resources Organization         Address  Phone  Notes  Alcohol [facility] Drug Services  [phone]   [facility]  [phone]   The [place]  [phone]   Floydene Flock  [phone]   Residential & Outpatient Substance Abuse Program  [phone]   Psychological  Services Organization         Address  Phone  Notes  [facility]  [phone]   [facility] Services  [phone]   [facility] Mental Health [address] [place] [phone] or  [phone]    Mobile Crisis Teams Organization         Address  Phone  Notes  [facility], Mobile Crisis Care Unit  [phone]   [facility]  [address] [place], [place] [phone]   [facility] [address] [place] [phone]    Self-Help/Support Groups Organization         Address  Phone             Notes  [facility] [place] - variety of support groups  [phone]- [phone] Call for more information  Narcotics Anonymous (NA), [facility] [address], [employer] [place]  2 meetings at this location   Statistician         Address  Phone  Notes  [facility] [address],    [place]  [phone]   [facility]  [address], [place] [address], [place], [place] [phone]   [facility] [address] [place], [place] [phone] Admissions: 8am-3pm M-F  [facility] [address]    [place], [place] [phone]   [facility] [address] [place], [place], [place] [phone]   [facility] [address],  [place], [place] [phone]   [facility] - Intensive Outpatient [address], [place], [place] [phone]   [facility] ([facility] [address] [place].,  [place], [place] [phone] or [phone]   [facility] ([facility]) [address], [place], [place] [phone] Accepts Medicaid  [facility] [place] [address]  [place] [phone] Substance Abuse/Addiction Treatment   [facility] Organization         Address  Phone  Notes  [facility]  [phone]   Angie Fava, PhD [address] [place], [place]   [phone] or [phone]   [facility] Behavioral   [address] [place], [place] [phone]   [facility] [address], [place], [place] [phone] Insurance/Medicaid/sponsorship through [facility] and Families [address]                                    [place], [place] [phone] Therapy/tele-psych/case  [facility] [address], [place] [phone]    Dr. Lolly Mustache  [phone]   [facility] [place]  [employer] [facility] Dept. 1) [address] [place] 2) [address], [place] 3)  [address], [place] [phone]  [phone]   [facility] Child Abuse Hotline [phone]) [phone] or [phone] (After Hours)

## 2015-03-20 NOTE — ED Notes (Signed)
Pt c/o RLQ pain x1hour with nausea/vomiting, pale in appearance

## 2015-07-11 ENCOUNTER — Encounter (HOSPITAL_BASED_OUTPATIENT_CLINIC_OR_DEPARTMENT_OTHER): Payer: Self-pay | Admitting: *Deleted

## 2015-07-11 ENCOUNTER — Emergency Department (HOSPITAL_BASED_OUTPATIENT_CLINIC_OR_DEPARTMENT_OTHER)
Admission: EM | Admit: 2015-07-11 | Discharge: 2015-07-11 | Disposition: A | Discharge status: Home / Self Care | Payer: Self-pay | Attending: Emergency Medicine | Admitting: Emergency Medicine

## 2015-07-11 ENCOUNTER — Emergency Department (HOSPITAL_BASED_OUTPATIENT_CLINIC_OR_DEPARTMENT_OTHER): Payer: Federal, State, Local not specified - PPO

## 2015-07-11 DIAGNOSIS — N200 Calculus of kidney: Secondary | ICD-10-CM | POA: Insufficient documentation

## 2015-07-11 DIAGNOSIS — Z79899 Other long term (current) drug therapy: Secondary | ICD-10-CM | POA: Insufficient documentation

## 2015-07-11 DIAGNOSIS — Z792 Long term (current) use of antibiotics: Secondary | ICD-10-CM | POA: Insufficient documentation

## 2015-07-11 DIAGNOSIS — Z87828 Personal history of other (healed) physical injury and trauma: Secondary | ICD-10-CM | POA: Insufficient documentation

## 2015-07-11 DIAGNOSIS — R1032 Left lower quadrant pain: Secondary | ICD-10-CM

## 2015-07-11 DIAGNOSIS — J45909 Unspecified asthma, uncomplicated: Secondary | ICD-10-CM | POA: Insufficient documentation

## 2015-07-11 DIAGNOSIS — Z720 Tobacco use: Secondary | ICD-10-CM | POA: Insufficient documentation

## 2015-07-11 LAB — CBC WITH DIFFERENTIAL/PLATELET
BASOS ABS: 0 10*3/uL (ref 0.0–0.1)
Basophils Relative: 0 % (ref 0–1)
EOS ABS: 0.1 10*3/uL (ref 0.0–0.7)
EOS PCT: 3 % (ref 0–5)
HCT: 40 % (ref 39.0–52.0)
HEMOGLOBIN: 13.1 g/dL (ref 13.0–17.0)
LYMPHS ABS: 2.6 10*3/uL (ref 0.7–4.0)
LYMPHS PCT: 54 % — AB (ref 12–46)
MCH: 29.6 pg (ref 26.0–34.0)
MCHC: 32.8 g/dL (ref 30.0–36.0)
MCV: 90.3 fL (ref 78.0–100.0)
MONOS PCT: 9 % (ref 3–12)
Monocytes Absolute: 0.4 10*3/uL (ref 0.1–1.0)
NEUTROS ABS: 1.6 10*3/uL — AB (ref 1.7–7.7)
Neutrophils Relative %: 34 % — ABNORMAL LOW (ref 43–77)
PLATELETS: 229 10*3/uL (ref 150–400)
RBC: 4.43 MIL/uL (ref 4.22–5.81)
RDW: 11.8 % (ref 11.5–15.5)
WBC: 4.7 10*3/uL (ref 4.0–10.5)

## 2015-07-11 LAB — URINALYSIS, ROUTINE W REFLEX MICROSCOPIC
Bilirubin Urine: NEGATIVE
GLUCOSE, UA: NEGATIVE mg/dL
KETONES UR: NEGATIVE mg/dL
Nitrite: NEGATIVE
PH: 6.5 (ref 5.0–8.0)
PROTEIN: NEGATIVE mg/dL
Specific Gravity, Urine: 1.017 (ref 1.005–1.030)
UROBILINOGEN UA: 1 mg/dL (ref 0.0–1.0)

## 2015-07-11 LAB — COMPREHENSIVE METABOLIC PANEL
ALK PHOS: 61 U/L (ref 38–126)
ALT: 18 U/L (ref 17–63)
AST: 25 U/L (ref 15–41)
Albumin: 4.6 g/dL (ref 3.5–5.0)
Anion gap: 4 — ABNORMAL LOW (ref 5–15)
BILIRUBIN TOTAL: 0.4 mg/dL (ref 0.3–1.2)
BUN: 16 mg/dL (ref 6–20)
CALCIUM: 9.6 mg/dL (ref 8.9–10.3)
CO2: 30 mmol/L (ref 22–32)
Chloride: 107 mmol/L (ref 101–111)
Creatinine, Ser: 1.04 mg/dL (ref 0.61–1.24)
GFR calc non Af Amer: >60 mL/min (ref >60)
GLUCOSE: 84 mg/dL (ref 65–99)
POTASSIUM: 4 mmol/L (ref 3.5–5.1)
Sodium: 141 mmol/L (ref 135–145)
Total Protein: 7.8 g/dL (ref 6.5–8.1)

## 2015-07-11 LAB — URINE MICROSCOPIC-ADD ON

## 2015-07-11 LAB — LIPASE, BLOOD: Lipase: 21 U/L — ABNORMAL LOW (ref 22–51)

## 2015-07-11 MED ORDER — ONDANSETRON HCL 4 MG PO TABS: 4.0000 mg | ORAL_TABLET | Freq: Four times a day (QID) | ORAL | Status: DC

## 2015-07-11 MED ORDER — SODIUM CHLORIDE 0.9 % IV BOLUS (SEPSIS)
1000.0000 mL | Freq: Once | INTRAVENOUS | Status: AC
Start: 2015-07-11 — End: 2015-07-11
  Administered 2015-07-11: 1000 mL via INTRAVENOUS

## 2015-07-11 MED ORDER — HYDROMORPHONE HCL 1 MG/ML IJ SOLN
1.0000 mg | Freq: Once | INTRAMUSCULAR | Status: AC
  Administered 2015-07-11: 1 mg via INTRAVENOUS
  Filled 2015-07-11: qty 1

## 2015-07-11 MED ORDER — TAMSULOSIN HCL 0.4 MG PO CAPS: 0.4000 mg | ORAL_CAPSULE | Freq: Every day | ORAL | Status: DC

## 2015-07-11 MED ORDER — KETOROLAC TROMETHAMINE 30 MG/ML IJ SOLN
30.0000 mg | Freq: Once | INTRAMUSCULAR | Status: AC
  Administered 2015-07-11: 30 mg via INTRAVENOUS
  Filled 2015-07-11: qty 1

## 2015-07-11 MED ORDER — ONDANSETRON HCL 4 MG/2ML IJ SOLN
4.0000 mg | Freq: Once | INTRAMUSCULAR | Status: AC
  Administered 2015-07-11: 4 mg via INTRAVENOUS
  Filled 2015-07-11: qty 2

## 2015-07-11 MED ORDER — OXYCODONE-ACETAMINOPHEN 5-325 MG PO TABS: 1.0000 | ORAL_TABLET | Freq: Four times a day (QID) | ORAL | Status: DC | PRN

## 2015-07-11 MED ORDER — NAPROXEN 500 MG PO TABS: 500.0000 mg | ORAL_TABLET | Freq: Two times a day (BID) | ORAL | Status: DC

## 2015-07-11 NOTE — ED Notes (Signed)
Side rails x 2 up, callbell within reach, bed in lowest position, cont POX monitoring initiated with int NBP readings

## 2015-07-11 NOTE — ED Notes (Signed)
Lower abdominal pain. Vomiting.

## 2015-07-11 NOTE — ED Provider Notes (Signed)
CSN: [id number]     Arrival date & time 07/11/15  1740 History   First MD Initiated Contact with Patient 07/11/15 1821     Chief Complaint  Patient presents with  . Abdominal Pain     (Consider location/radiation/quality/duration/timing/severity/associated sxs/prior Treatment) Patient is a 24 y.o. male presenting with abdominal pain.  Abdominal Pain Pain location:  LLQ Pain quality: aching and sharp   Pain radiates to:  Does not radiate Pain severity:  Severe Onset quality:  Gradual Duration:  1 week Timing:  Constant Progression:  Worsening (waxing and waning, however worsening today) Chronicity:  New Relieved by:  Nothing Worsened by:  Nothing tried Ineffective treatments:  NSAIDs Associated symptoms: nausea   Associated symptoms: no chest pain, no constipation, no diarrhea (now resolved), no fever, no hematuria, no shortness of breath, no sore throat and no vomiting (now resolved)     Past Medical History  Diagnosis Date  . Kidney stones   . Asthma   . 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 Ureteral stent placement  2012   No family history on file. History  Substance Use Topics  . Smoking status: Current Every Day Smoker -- 0.20 packs/day for 6 years    Types: Cigarettes  . Smokeless tobacco: Never Used  . Alcohol Use: No    Review of Systems  Constitutional: Positive for appetite change. Negative for fever.  HENT: Negative for sore throat.   Eyes: Negative for visual disturbance.  Respiratory: Negative for shortness of breath.   Cardiovascular: Negative for chest pain.  Gastrointestinal: Positive for nausea. Negative for vomiting (now resolved), abdominal pain, diarrhea (now resolved) and constipation.  Genitourinary: Negative for hematuria, flank pain and difficulty urinating.  Musculoskeletal: Negative for back pain and neck stiffness.  Skin: Negative for  rash.  Neurological: Negative for syncope and headaches.      Allergies  Bee venom  Home Medications   Prior to Admission medications   Medication Sig Start Date End Date Taking? Authorizing Provider  albuterol (PROVENTIL HFA;VENTOLIN HFA) 108 (90 BASE) MCG/ACT inhaler Inhale 2 puffs into the lungs every 6 (six) hours as needed for wheezing or shortness of breath. For shortness of breath and wheezing    Historical Provider, MD  cephALEXin (KEFLEX) 500 MG capsule Take 1 capsule (500 mg total) by mouth 4 (four) times daily. 03/20/15   April Palumbo, MD  EPINEPHrine (EPI-PEN) 0.3 mg/0.3 mL DEVI Inject 0.3 mg into the muscle daily as needed (Anaphylaxis). 11/01/12   Teressa LowerVrinda Pickering, NP  Fluticasone-Salmeterol (ADVAIR) 250-50 MCG/DOSE AEPB Inhale 1 puff into the lungs every 12 (twelve) hours as needed (Asthma).     Historical Provider, MD  naproxen (NAPROSYN) 500 MG tablet Take 1 tablet (500 mg total) by mouth 2 (two) times daily with a meal. 07/11/15   Alvira MondayErin Odean Mcelwain, MD  ondansetron (ZOFRAN) 4 MG tablet Take 1 tablet (4 mg total) by mouth every 6 (six) hours. 07/11/15   Alvira MondayErin Kimmora Risenhoover, MD  oxyCODONE-acetaminophen (PERCOCET) 5-325 MG per tablet Take 1 tablet by mouth every 6 (six) hours as needed. 07/11/15   Alvira MondayErin Mitul Hallowell, MD  tamsulosin (FLOMAX) 0.4 MG CAPS capsule Take 1 capsule (0.4 mg total) by mouth daily. 07/11/15   Alvira MondayErin Averiana Clouatre, MD   BP 93/50 mmHg  Pulse 55  Temp(Src) 98.3 F (36.8 C) (Oral)  Resp 18  Ht 5\' 11"  (1.803 m)  Wt  145 lb (65.772 kg)  BMI 20.23 kg/m2  SpO2 100% Physical Exam  Constitutional: He is oriented to person, place, and time. He appears well-developed and well-nourished. No distress.  HENT:  Head: Normocephalic and atraumatic.  Eyes: Conjunctivae and EOM are normal.  Neck: Normal range of motion.  Cardiovascular: Normal rate, regular rhythm, normal heart sounds and intact distal pulses.  Exam reveals no gallop and no friction rub.   No murmur  heard. Pulmonary/Chest: Effort normal and breath sounds normal. No respiratory distress. He has no wheezes. He has no rales.  Abdominal: Soft. He exhibits no distension. There is tenderness (LLQ). There is no guarding, no CVA tenderness, no tenderness at McBurney's point and negative Murphy's sign.  Musculoskeletal: He exhibits no edema.  Neurological: He is alert and oriented to person, place, and time.  Skin: Skin is warm and dry. He is not diaphoretic.  Nursing note and vitals reviewed.   ED Course  Procedures (including critical care time) Labs Review Labs Reviewed  URINALYSIS, ROUTINE W REFLEX MICROSCOPIC (NOT AT [facility]) - Abnormal; Notable for the following:    APPearance CLOUDY (*)    Hgb urine dipstick MODERATE (*)    Leukocytes, UA SMALL (*)    All other components within normal limits  CBC WITH DIFFERENTIAL/PLATELET - Abnormal; Notable for the following:    Neutrophils Relative % 34 (*)    Neutro Abs 1.6 (*)    Lymphocytes Relative 54 (*)    All other components within normal limits  COMPREHENSIVE METABOLIC PANEL - Abnormal; Notable for the following:    Anion gap 4 (*)    All other components within normal limits  LIPASE, BLOOD - Abnormal; Notable for the following:    Lipase 21 (*)    All other components within normal limits  URINE MICROSCOPIC-ADD ON    Imaging Review Ct Renal Stone Study  07/11/2015   CLINICAL DATA:  Left lower abdominal pain for 1 week  EXAM: CT ABDOMEN AND PELVIS WITHOUT CONTRAST  TECHNIQUE: Multidetector CT imaging of the abdomen and pelvis was performed following the standard protocol without IV contrast.  COMPARISON:  03/20/2015  FINDINGS: Lower chest: No pleural or pericardial effusion. The lung bases are clear.  Hepatobiliary: There is no suspicious liver abnormality. The gallbladder appears normal. No biliary dilatation  Pancreas: Negative  Spleen: Negative  Adrenals/Urinary Tract: The adrenal glands are both negative. Bilateral nephrolithiasis  noted the largest right renal calculus measures 6 mm, image 45/series 5. On the left there is a stone in the left UPJ measuring 9 mm, image 37/series 5. The urinary bladder appears within normal limits.  Stomach/Bowel: The stomach appears normal. The small bowel loops have a normal course and caliber. No obstruction. The appendix is visualized and appears normal. Unremarkable appearance of the colon.  Vascular/Lymphatic: Normal appearance of the abdominal aorta. No enlarged retroperitoneal or mesenteric adenopathy. No enlarged pelvic or inguinal lymph nodes.  Reproductive: Prostate glands and seminal vesicles are unremarkable.  Other: There is no ascites or focal fluid collections within the abdomen or pelvis.  Musculoskeletal: The visualized bony structures are unremarkable. No aggressive lytic or sclerotic bone lesion.  IMPRESSION: 1. Left UPJ calculus measures 9 mm. 2. Nonobstructing right renal calculi.   Electronically Signed   By: Signa Kell M.D.   On: 07/11/2015 20:38     EKG Interpretation None      MDM   Final diagnoses:  Left groin pain  Nephrolithiasis, 9mm UPJ stone left kidney, 6mm nonobstructing stone  on right   24yo male with history of nephrolithiasis requiring stenting, lithotripsy, asthma presents with concern of left lower quadrant abdominal pain, nausea and vomiting for 1 week.  CT stone study shows 9mm left UPJ calculus.  No sign of UTI.  Low suspicion for other causes of abd pain including low suspicion for appendicitis, diverticulitis, cholecystitis.  Pt tolerating po.  Given toradol and dilaudid for pain control.  Wrote rx for naproxen, zofran, percocet and recommended close Urology follow up or return to ED if symptoms are not controlled at home, if he develops fevers, intractable n/v. Patient discharged in stable condition with understanding of reasons to return.     Alvira Monday, MD 07/12/15 1421

## 2015-07-11 NOTE — ED Notes (Signed)
MD at bedside. 

## 2015-07-11 NOTE — ED Notes (Signed)
Pt states has had kidney stone before, but denies any radiation to back

## 2015-07-11 NOTE — ED Notes (Signed)
Presents with abd pain, onset approx 1 week, pain increased today, having chills, nausea and vomiting last PM, appetite poor.

## 2015-08-23 ENCOUNTER — Emergency Department (HOSPITAL_BASED_OUTPATIENT_CLINIC_OR_DEPARTMENT_OTHER)
Admission: EM | Admit: 2015-08-23 | Discharge: 2015-08-23 | Disposition: A | Discharge status: Home / Self Care | Payer: Federal, State, Local not specified - PPO | Attending: Emergency Medicine | Admitting: Emergency Medicine

## 2015-08-23 ENCOUNTER — Encounter (HOSPITAL_BASED_OUTPATIENT_CLINIC_OR_DEPARTMENT_OTHER): Payer: Self-pay | Admitting: *Deleted

## 2015-08-23 DIAGNOSIS — Z792 Long term (current) use of antibiotics: Secondary | ICD-10-CM | POA: Insufficient documentation

## 2015-08-23 DIAGNOSIS — Z7951 Long term (current) use of inhaled steroids: Secondary | ICD-10-CM | POA: Insufficient documentation

## 2015-08-23 DIAGNOSIS — N200 Calculus of kidney: Secondary | ICD-10-CM

## 2015-08-23 DIAGNOSIS — J45909 Unspecified asthma, uncomplicated: Secondary | ICD-10-CM | POA: Insufficient documentation

## 2015-08-23 DIAGNOSIS — Z791 Long term (current) use of non-steroidal anti-inflammatories (NSAID): Secondary | ICD-10-CM | POA: Insufficient documentation

## 2015-08-23 DIAGNOSIS — Z720 Tobacco use: Secondary | ICD-10-CM | POA: Insufficient documentation

## 2015-08-23 DIAGNOSIS — Z87828 Personal history of other (healed) physical injury and trauma: Secondary | ICD-10-CM | POA: Insufficient documentation

## 2015-08-23 DIAGNOSIS — Z79899 Other long term (current) drug therapy: Secondary | ICD-10-CM | POA: Insufficient documentation

## 2015-08-23 LAB — BASIC METABOLIC PANEL
Anion gap: 10 (ref 5–15)
BUN: 15 mg/dL (ref 6–20)
CALCIUM: 9.3 mg/dL (ref 8.9–10.3)
CO2: 27 mmol/L (ref 22–32)
CREATININE: 1.07 mg/dL (ref 0.61–1.24)
Chloride: 106 mmol/L (ref 101–111)
GFR calc Af Amer: >60 mL/min (ref >60)
GFR calc non Af Amer: >60 mL/min (ref >60)
Glucose, Bld: 89 mg/dL (ref 65–99)
Potassium: 3.9 mmol/L (ref 3.5–5.1)
Sodium: 143 mmol/L (ref 135–145)

## 2015-08-23 LAB — CBC WITH DIFFERENTIAL/PLATELET
BASOS PCT: 0 % (ref 0–1)
Basophils Absolute: 0 10*3/uL (ref 0.0–0.1)
EOS PCT: 2 % (ref 0–5)
Eosinophils Absolute: 0.1 10*3/uL (ref 0.0–0.7)
HCT: 42.6 % (ref 39.0–52.0)
Hemoglobin: 13.9 g/dL (ref 13.0–17.0)
Lymphocytes Relative: 34 % (ref 12–46)
Lymphs Abs: 2.5 10*3/uL (ref 0.7–4.0)
MCH: 29.4 pg (ref 26.0–34.0)
MCHC: 32.6 g/dL (ref 30.0–36.0)
MCV: 90.1 fL (ref 78.0–100.0)
MONO ABS: 0.5 10*3/uL (ref 0.1–1.0)
MONOS PCT: 7 % (ref 3–12)
NEUTROS ABS: 4.2 10*3/uL (ref 1.7–7.7)
Neutrophils Relative %: 57 % (ref 43–77)
Platelets: 213 10*3/uL (ref 150–400)
RBC: 4.73 MIL/uL (ref 4.22–5.81)
RDW: 12.3 % (ref 11.5–15.5)
WBC: 7.4 10*3/uL (ref 4.0–10.5)

## 2015-08-23 MED ORDER — HYDROMORPHONE HCL 1 MG/ML IJ SOLN
0.5000 mg | Freq: Once | INTRAMUSCULAR | Status: AC
Start: 2015-08-23 — End: 2015-08-23
  Administered 2015-08-23: 0.5 mg via INTRAVENOUS
  Filled 2015-08-23: qty 1

## 2015-08-23 MED ORDER — ONDANSETRON HCL 4 MG/2ML IJ SOLN
4.0000 mg | Freq: Once | INTRAMUSCULAR | Status: AC
  Administered 2015-08-23: 4 mg via INTRAVENOUS
  Filled 2015-08-23: qty 2

## 2015-08-23 MED ORDER — SODIUM CHLORIDE 0.9 % IV BOLUS (SEPSIS)
1000.0000 mL | Freq: Once | INTRAVENOUS | Status: AC
  Administered 2015-08-23: 1000 mL via INTRAVENOUS

## 2015-08-23 MED ORDER — PROMETHAZINE HCL 25 MG/ML IJ SOLN
12.5000 mg | INTRAMUSCULAR | Status: DC | PRN
  Administered 2015-08-23: 12.5 mg via INTRAVENOUS
  Filled 2015-08-23: qty 1

## 2015-08-23 MED ORDER — KETOROLAC TROMETHAMINE 30 MG/ML IJ SOLN
15.0000 mg | Freq: Once | INTRAMUSCULAR | Status: AC
  Administered 2015-08-23: 15 mg via INTRAVENOUS
  Filled 2015-08-23: qty 1

## 2015-08-23 MED ORDER — FENTANYL CITRATE (PF) 100 MCG/2ML IJ SOLN
50.0000 ug | Freq: Once | INTRAMUSCULAR | Status: AC
  Administered 2015-08-23: 50 ug via INTRAVENOUS
  Filled 2015-08-23: qty 2

## 2015-08-23 MED ORDER — ONDANSETRON HCL 4 MG PO TABS: 4.0000 mg | ORAL_TABLET | Freq: Four times a day (QID) | ORAL | Status: DC

## 2015-08-23 MED ORDER — OXYCODONE-ACETAMINOPHEN 5-325 MG PO TABS: 1.0000 | ORAL_TABLET | Freq: Four times a day (QID) | ORAL | Status: DC | PRN

## 2015-08-23 NOTE — ED Notes (Signed)
Presents with rt flank pain, but has tenderness at left flank also. States has been having nausea/ vomiting

## 2015-08-23 NOTE — ED Notes (Signed)
Pt reports he has a 9mm kidney stone- sees Urology on Monday- vomiting x 1 hour

## 2015-08-23 NOTE — Discharge Instructions (Signed)

## 2015-08-23 NOTE — ED Provider Notes (Signed)
CSN: [id number]     Arrival date & time 08/23/15  1212 History   First MD Initiated Contact with Patient 08/23/15 1239     Chief Complaint  Patient presents with  . Flank Pain   24 y/o man with history of nephrolithiasis presents with right flank pain, nausea and vomiting, and chills and subjective fever. He was previously controlling symptoms with zofran, tamsulosin, percocet, and naproxen. He is out of these medications from 2 days ago and symptoms are now uncontrolled since this morning. He has vomiting numerous times of gastric contents and food, without bilious vomiting or hematemesis. His pain is sharp, severe, colicky, and worst at right flank with radiation to the abdomen. He also reports decreased urine output over the past 2 days, without gross hematuria.  He has multiple large right sided stones in his kidney and ureter based on CT from July, which was followed by urology and is scheduled for ultrasonic lithotripsy with Dr. Annabell Howells on 08/25/15. He has previous large radioopaque stones requiring lithotripsy due to persistent obstruction and pain, without large hydronephrosis or renal injury.  Presents today with brother and girlfriend visiting at bedside for collateral history.  (Consider location/radiation/quality/duration/timing/severity/associated sxs/prior Treatment) Patient is a 24 y.o. male presenting with flank pain. The history is provided by the patient, a significant other and a relative.  Flank Pain This is a recurrent problem. The current episode started 1 to 4 weeks ago. The problem occurs constantly. The problem has been rapidly worsening. Associated symptoms include abdominal pain, chills, a fever, nausea, urinary symptoms and vomiting. Pertinent negatives include no chest pain. The symptoms are aggravated by standing. He has tried oral narcotics, rest and NSAIDs for the symptoms. The treatment provided significant relief.    Past Medical History  Diagnosis Date  . Kidney  stones   . Asthma   . 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 Ureteral stent placement  2012   No family history on file. Social History  Substance Use Topics  . Smoking status: Current Every Day Smoker -- 0.20 packs/day for 6 years    Types: Cigarettes  . Smokeless tobacco: Never Used  . Alcohol Use: No    Review of Systems  Constitutional: Positive for fever and chills.  Respiratory: Negative for chest tightness and shortness of breath.   Cardiovascular: Negative for chest pain and palpitations.  Gastrointestinal: Positive for nausea, vomiting and abdominal pain.  Genitourinary: Positive for flank pain, decreased urine volume and difficulty urinating. Negative for hematuria and discharge.    Allergies  Bee venom  Home Medications   Prior to Admission medications   Medication Sig Start Date End Date Taking? Authorizing Provider  albuterol (PROVENTIL HFA;VENTOLIN HFA) 108 (90 BASE) MCG/ACT inhaler Inhale 2 puffs into the lungs every 6 (six) hours as needed for wheezing or shortness of breath. For shortness of breath and wheezing   Yes Historical Provider, MD  EPINEPHrine (EPI-PEN) 0.3 mg/0.3 mL DEVI Inject 0.3 mg into the muscle daily as needed (Anaphylaxis). 11/01/12  Yes Teressa Lower, NP  Fluticasone-Salmeterol (ADVAIR) 250-50 MCG/DOSE AEPB Inhale 1 puff into the lungs every 12 (twelve) hours as needed (Asthma).    Yes Historical Provider, MD  cephALEXin (KEFLEX) 500 MG capsule Take 1 capsule (500 mg total) by mouth 4 (four) times daily. 03/20/15   April Palumbo, MD  naproxen (NAPROSYN) 500 MG tablet Take 1 tablet (  500 mg total) by mouth 2 (two) times daily with a meal. 07/11/15   Alvira Monday, MD  ondansetron (ZOFRAN) 4 MG tablet Take 1 tablet (4 mg total) by mouth every 6 (six) hours. 08/23/15   Nelva Nay, MD  oxyCODONE-acetaminophen (PERCOCET) 5-325 MG per tablet Take 1  tablet by mouth every 6 (six) hours as needed. 08/23/15   Nelva Nay, MD  tamsulosin (FLOMAX) 0.4 MG CAPS capsule Take 1 capsule (0.4 mg total) by mouth daily. 07/11/15   Alvira Monday, MD   BP 119/54 mmHg  Pulse 78  Temp(Src) 97.9 F (36.6 C) (Oral)  Resp 16  Ht  (1.854 m)  Wt 145 lb (65.772 kg)  BMI 19.13 kg/m2  SpO2 100%   Physical Exam  Constitutional: He appears well-developed and well-nourished.  HENT:  Oral mucosa appears dry  Cardiovascular: Normal rate, regular rhythm, normal heart sounds and intact distal pulses.   Pulmonary/Chest: Effort normal and breath sounds normal.  Abdominal: Soft. He exhibits no distension. There is tenderness in the right lower quadrant. There is guarding and CVA tenderness. There is no rebound.  Right CVA TTP, with extension along right side towards midline of abdomen. Minimal suprapubic TTP.  Musculoskeletal:       Lumbar back: He exhibits tenderness.       Back:  Skin: Skin is warm and dry.    ED Course  Procedures (including critical care time) Labs Review Labs Reviewed  CBC WITH DIFFERENTIAL/PLATELET  BASIC METABOLIC PANEL   Imaging Review No results found. I have personally reviewed and evaluated these images and lab results as part of my medical decision-making.   EKG Interpretation None      MDM   Final diagnoses:  Kidney stones    24 y/o man with known nephrolithiasis with worsening flank pain, nausea, vomiting. He has subjective fever and chills but temperature is normal on presentation to ED, is not diaphoretic and shivering improved with pain medication. Emesis with gastric contents noted in bedside vomit bag. CBC shows normal leukocyte count of 7.2, Bmet does not demonstrate acute renal injury. He states is not able to provide urine sample from difficulty urinating. Decreased urine output most likely related to pain or his poor PO intake and vomiting today rather than a bilateral obstructive nephropathy given  normal renal function on metabolic panel. No signs are concerning for active infection besides subjective fever that is not identified during observation at ED.  Patient given IV zofran with partial resolution of nausea and vomiting. Patient fell asleep after majority of pain relieved with IV diluaded. Mildly elevated heart rate improved to 70s after 1L IV NS and pain medication were given. After reevaluation treated again with fentanyl and toradol injection for further resolution of symptoms. Discharged to home with short course of percocet and zofran to take for symptomatic relief until his scheduled lithotripsy on Monday. Patient encouraged to increase PO water intake after returning home and importance in context of vomiting, kidney stones was explained. Patient given follow up instructions to return to ED at [facility] if his condition worsens or is unable to be controlled on the provided treatments.  Fuller Plan, MD 08/24/15 1610  Nelva Nay, MD 08/24/15 [phone]

## 2015-08-23 NOTE — ED Notes (Signed)
BMET recollected per lab request

## 2015-08-23 NOTE — ED Notes (Signed)
MD at bedside. 

## 2015-08-23 NOTE — ED Notes (Signed)
Pt appears to be sleeping.

## 2015-08-23 NOTE — ED Notes (Signed)
Family at bedside. 

## 2015-08-24 ENCOUNTER — Observation Stay (HOSPITAL_COMMUNITY): Payer: Federal, State, Local not specified - PPO

## 2015-08-24 ENCOUNTER — Observation Stay (HOSPITAL_COMMUNITY)
Admission: EM | Admit: 2015-08-24 | Discharge: 2015-08-26 | Disposition: A | Discharge status: Home / Self Care | Payer: Self-pay | Attending: Urology | Admitting: Urology

## 2015-08-24 ENCOUNTER — Encounter (HOSPITAL_COMMUNITY): Payer: Self-pay

## 2015-08-24 DIAGNOSIS — N200 Calculus of kidney: Secondary | ICD-10-CM

## 2015-08-24 DIAGNOSIS — F17210 Nicotine dependence, cigarettes, uncomplicated: Secondary | ICD-10-CM | POA: Insufficient documentation

## 2015-08-24 DIAGNOSIS — R112 Nausea with vomiting, unspecified: Secondary | ICD-10-CM | POA: Insufficient documentation

## 2015-08-24 DIAGNOSIS — N201 Calculus of ureter: Principal | ICD-10-CM | POA: Diagnosis present

## 2015-08-24 DIAGNOSIS — Z87442 Personal history of urinary calculi: Secondary | ICD-10-CM | POA: Insufficient documentation

## 2015-08-24 DIAGNOSIS — J45909 Unspecified asthma, uncomplicated: Secondary | ICD-10-CM | POA: Insufficient documentation

## 2015-08-24 LAB — URINALYSIS, ROUTINE W REFLEX MICROSCOPIC
Glucose, UA: NEGATIVE mg/dL
Ketones, ur: 40 mg/dL — AB
Leukocytes, UA: NEGATIVE
Nitrite: NEGATIVE
Protein, ur: NEGATIVE mg/dL
Specific Gravity, Urine: 1.029 (ref 1.005–1.030)
Urobilinogen, UA: 1 mg/dL (ref 0.0–1.0)
pH: 6 (ref 5.0–8.0)

## 2015-08-24 LAB — BASIC METABOLIC PANEL
ANION GAP: 10 (ref 5–15)
BUN: 20 mg/dL (ref 6–20)
CALCIUM: 9.3 mg/dL (ref 8.9–10.3)
CHLORIDE: 106 mmol/L (ref 101–111)
CO2: 24 mmol/L (ref 22–32)
Creatinine, Ser: 1.68 mg/dL — ABNORMAL HIGH (ref 0.61–1.24)
GFR calc Af Amer: >60 mL/min (ref >60)
GFR calc non Af Amer: 55 mL/min — ABNORMAL LOW (ref >60)
GLUCOSE: 116 mg/dL — AB (ref 65–99)
Potassium: 3.6 mmol/L (ref 3.5–5.1)
Sodium: 140 mmol/L (ref 135–145)

## 2015-08-24 LAB — URINE MICROSCOPIC-ADD ON

## 2015-08-24 LAB — CBC WITH DIFFERENTIAL/PLATELET
Basophils Absolute: 0 K/uL (ref 0.0–0.1)
Basophils Relative: 0 % (ref 0–1)
Eosinophils Absolute: 0 K/uL (ref 0.0–0.7)
Eosinophils Relative: 0 % (ref 0–5)
HCT: 37.2 % — ABNORMAL LOW (ref 39.0–52.0)
Hemoglobin: 12.5 g/dL — ABNORMAL LOW (ref 13.0–17.0)
Lymphocytes Relative: 14 % (ref 12–46)
Lymphs Abs: 1.8 K/uL (ref 0.7–4.0)
MCH: 30.5 pg (ref 26.0–34.0)
MCHC: 33.6 g/dL (ref 30.0–36.0)
MCV: 90.7 fL (ref 78.0–100.0)
Monocytes Absolute: 1.1 K/uL — ABNORMAL HIGH (ref 0.1–1.0)
Monocytes Relative: 8 % (ref 3–12)
Neutro Abs: 9.8 K/uL — ABNORMAL HIGH (ref 1.7–7.7)
Neutrophils Relative %: 78 % — ABNORMAL HIGH (ref 43–77)
Platelets: 206 K/uL (ref 150–400)
RBC: 4.1 MIL/uL — ABNORMAL LOW (ref 4.22–5.81)
RDW: 12.6 % (ref 11.5–15.5)
WBC: 12.7 K/uL — ABNORMAL HIGH (ref 4.0–10.5)

## 2015-08-24 MED ORDER — SODIUM CHLORIDE 0.9 % IV SOLN
INTRAVENOUS | Status: DC
  Administered 2015-08-24 – 2015-08-26 (×5): via INTRAVENOUS

## 2015-08-24 MED ORDER — HYDROMORPHONE HCL 1 MG/ML IJ SOLN
0.5000 mg | INTRAMUSCULAR | Status: DC | PRN
  Administered 2015-08-24 – 2015-08-26 (×11): 1 mg via INTRAVENOUS
  Filled 2015-08-24 (×11): qty 1

## 2015-08-24 MED ORDER — OXYBUTYNIN CHLORIDE ER 10 MG PO TB24
10.0000 mg | ORAL_TABLET | Freq: Every day | ORAL | Status: DC
  Administered 2015-08-24 – 2015-08-26 (×3): 10 mg via ORAL
  Filled 2015-08-24 (×3): qty 1

## 2015-08-24 MED ORDER — FENTANYL CITRATE (PF) 100 MCG/2ML IJ SOLN
100.0000 ug | Freq: Once | INTRAMUSCULAR | Status: AC
  Administered 2015-08-24: 100 ug via INTRAVENOUS
  Filled 2015-08-24: qty 2

## 2015-08-24 MED ORDER — FENTANYL CITRATE (PF) 100 MCG/2ML IJ SOLN
50.0000 ug | INTRAMUSCULAR | Status: DC | PRN
  Administered 2015-08-24 – 2015-08-25 (×3): 50 ug via INTRAVENOUS
  Filled 2015-08-24 (×3): qty 2

## 2015-08-24 MED ORDER — ONDANSETRON HCL 4 MG/2ML IJ SOLN
4.0000 mg | INTRAMUSCULAR | Status: DC | PRN
  Administered 2015-08-24 – 2015-08-25 (×6): 4 mg via INTRAVENOUS
  Filled 2015-08-24 (×6): qty 2

## 2015-08-24 MED ORDER — DIPHENHYDRAMINE HCL 50 MG/ML IJ SOLN
12.5000 mg | Freq: Four times a day (QID) | INTRAMUSCULAR | Status: DC | PRN
  Administered 2015-08-25: 12.5 mg via INTRAVENOUS
  Filled 2015-08-24: qty 1

## 2015-08-24 MED ORDER — SODIUM CHLORIDE 0.9 % IV BOLUS (SEPSIS)
1000.0000 mL | Freq: Once | INTRAVENOUS | Status: AC
  Administered 2015-08-24: 1000 mL via INTRAVENOUS

## 2015-08-24 MED ORDER — DIPHENHYDRAMINE HCL 12.5 MG/5ML PO ELIX: 12.5000 mg | ORAL_SOLUTION | Freq: Four times a day (QID) | ORAL | Status: DC | PRN

## 2015-08-24 MED ORDER — ONDANSETRON HCL 4 MG/2ML IJ SOLN
4.0000 mg | Freq: Once | INTRAMUSCULAR | Status: AC
  Administered 2015-08-24: 4 mg via INTRAVENOUS
  Filled 2015-08-24: qty 2

## 2015-08-24 MED ORDER — OXYCODONE-ACETAMINOPHEN 5-325 MG PO TABS
1.0000 | ORAL_TABLET | ORAL | Status: DC | PRN
  Administered 2015-08-24: 1 via ORAL
  Administered 2015-08-25 – 2015-08-26 (×4): 2 via ORAL
  Filled 2015-08-24 (×2): qty 2
  Filled 2015-08-24: qty 1
  Filled 2015-08-24: qty 2
  Filled 2015-08-24 (×2): qty 1

## 2015-08-24 NOTE — H&P (Signed)
Urology Admission H&P  Chief Complaint: left flank pain  History of Present Illness: Nathan Tanner is a 24yo with a hx of nephrolithaisis who presented to the ER today with severe, intermittent, nonradiating left flank pain with associated nausea and vomiting. This is his 4th presentation to the ER. On 07/11/2015 he had a CT scan which showed a Left 79m UPJ stone and a 668mright upper pole stone. This is his 5th stone event. His last procedure for stone was in 01/2014. His creatinine is 1.7 up from a baselinine of 1. His WBC count is 12.7.   Past Medical History  Diagnosis Date  . Kidney stones   . Asthma   . 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 Ureteral stent placement  2012    Home Medications:  Prescriptions prior to admission  Medication Sig Dispense Refill Last Dose  . albuterol (PROVENTIL HFA;VENTOLIN HFA) 108 (90 BASE) MCG/ACT inhaler Inhale 2 puffs into the lungs every 6 (six) hours as needed for wheezing or shortness of breath. For shortness of breath and wheezing   unknown  . EPINEPHrine (EPI-PEN) 0.3 mg/0.3 mL DEVI Inject 0.3 mg into the muscle daily as needed (Anaphylaxis).   unknown  . Fluticasone-Salmeterol (ADVAIR) 250-50 MCG/DOSE AEPB Inhale 1 puff into the lungs every 12 (twelve) hours as needed (Asthma).    unknown  . cephALEXin (KEFLEX) 500 MG capsule Take 1 capsule (500 mg total) by mouth 4 (four) times daily. (Patient not taking: Reported on 08/24/2015) 28 capsule 0 Not Taking at Unknown time  . naproxen (NAPROSYN) 500 MG tablet Take 1 tablet (500 mg total) by mouth 2 (two) times daily with a meal. (Patient not taking: Reported on 08/24/2015) 30 tablet 0 Not Taking at Unknown time  . ondansetron (ZOFRAN) 4 MG tablet Take 1 tablet (4 mg total) by mouth every 6 (six) hours. (Patient not taking: Reported on 08/24/2015) 16 tablet 0 Not Taking at Unknown time  . oxyCODONE-acetaminophen  (PERCOCET) 5-325 MG per tablet Take 1 tablet by mouth every 6 (six) hours as needed. (Patient not taking: Reported on 08/24/2015) 20 tablet 0 Not Taking at Unknown time  . tamsulosin (FLOMAX) 0.4 MG CAPS capsule Take 1 capsule (0.4 mg total) by mouth daily. (Patient not taking: Reported on 08/24/2015) 30 capsule 0 Not Taking at Unknown time   Allergies:  Allergies  Allergen Reactions  . Bee Venom Shortness Of Breath    Yellow Jacket    History reviewed. No pertinent family history. Social History:  reports that he has been smoking Cigarettes.  He has a 1.2 pack-year smoking history. He has never used smokeless tobacco. He reports that he does not drink alcohol or use illicit drugs.  Review of Systems  Gastrointestinal: Positive for nausea and vomiting.  Genitourinary: Positive for flank pain.  All other systems reviewed and are negative.   Physical Exam:  Vital signs in last 24 hours: Temp:  [97.8 F (36.6 C)-98.5 F (36.9 C)] 98.5 F (36.9 C) (08/28 1551) Pulse Rate:  [62-83] 62 (08/28 1551) Resp:  [18] 18 (08/28 1436) BP: (132-139)/(75-77) 139/75 mmHg (08/28 1551) SpO2:  [100 %] 100 % (08/28 1551) Weight:  [63.322 kg (139 lb 9.6 oz)] 63.322 kg (139 lb 9.6 oz) (08/28 1551) Physical Exam  Constitutional: He is oriented to person, place, and time. He appears well-developed and well-nourished.  HENT:  Head: Normocephalic and  atraumatic.  Eyes: EOM are normal. Pupils are equal, round, and reactive to light.  Neck: Normal range of motion. Neck supple.  Cardiovascular: Normal rate and regular rhythm.   Respiratory: Effort normal and breath sounds normal.  GI: Soft. He exhibits no distension. There is no tenderness.  Genitourinary: No penile tenderness.  Musculoskeletal: Normal range of motion.  Neurological: He is alert and oriented to person, place, and time.  Skin: Skin is warm and dry.  Psychiatric: He has a normal mood and affect. His behavior is normal. Judgment and thought  content normal.    Laboratory Data:  Results for orders placed or performed during the hospital encounter of 08/24/15 (from the past 24 hour(s))  CBC with Differential/Platelet     Status: Abnormal   Collection Time: 08/24/15 12:15 PM  Result Value Ref Range   WBC 12.7 (H) 4.0 - 10.5 K/uL   RBC 4.10 (L) 4.22 - 5.81 MIL/uL   Hemoglobin 12.5 (L) 13.0 - 17.0 g/dL   HCT 37.2 (L) 39.0 - 52.0 %   MCV 90.7 78.0 - 100.0 fL   MCH 30.5 26.0 - 34.0 pg   MCHC 33.6 30.0 - 36.0 g/dL   RDW 12.6 11.5 - 15.5 %   Platelets 206 150 - 400 K/uL   Neutrophils Relative % 78 (H) 43 - 77 %   Neutro Abs 9.8 (H) 1.7 - 7.7 K/uL   Lymphocytes Relative 14 12 - 46 %   Lymphs Abs 1.8 0.7 - 4.0 K/uL   Monocytes Relative 8 3 - 12 %   Monocytes Absolute 1.1 (H) 0.1 - 1.0 K/uL   Eosinophils Relative 0 0 - 5 %   Eosinophils Absolute 0.0 0.0 - 0.7 K/uL   Basophils Relative 0 0 - 1 %   Basophils Absolute 0.0 0.0 - 0.1 K/uL  Basic metabolic panel     Status: Abnormal   Collection Time: 08/24/15 12:15 PM  Result Value Ref Range   Sodium 140 135 - 145 mmol/L   Potassium 3.6 3.5 - 5.1 mmol/L   Chloride 106 101 - 111 mmol/L   CO2 24 22 - 32 mmol/L   Glucose, Bld 116 (H) 65 - 99 mg/dL   BUN 20 6 - 20 mg/dL   Creatinine, Ser 1.68 (H) 0.61 - 1.24 mg/dL   Calcium 9.3 8.9 - 10.3 mg/dL   GFR calc non Af Amer 55 (L) >60 mL/min   GFR calc Af Amer >60 >60 mL/min   Anion gap 10 5 - 15  Urinalysis, Routine w reflex microscopic (not at [facility])     Status: Abnormal   Collection Time: 08/24/15 12:47 PM  Result Value Ref Range   Color, Urine YELLOW YELLOW   APPearance CLOUDY (A) CLEAR   Specific Gravity, Urine 1.029 1.005 - 1.030   pH 6.0 5.0 - 8.0   Glucose, UA NEGATIVE NEGATIVE mg/dL   Hgb urine dipstick MODERATE (A) NEGATIVE   Bilirubin Urine SMALL (A) NEGATIVE   Ketones, ur 40 (A) NEGATIVE mg/dL   Protein, ur NEGATIVE NEGATIVE mg/dL   Urobilinogen, UA 1.0 0.0 - 1.0 mg/dL   Nitrite NEGATIVE NEGATIVE   Leukocytes, UA  NEGATIVE NEGATIVE  Urine microscopic-add on     Status: None   Collection Time: 08/24/15 12:47 PM  Result Value Ref Range   WBC, UA 3-6 <3 WBC/hpf   RBC / HPF 21-50 <3 RBC/hpf   Bacteria, UA RARE RARE   Urine-Other MUCOUS PRESENT    No results found for this  or any previous visit (from the past 240 hour(s)). Creatinine:  Recent Labs  08/23/15 1355 08/24/15 1215  CREATININE 1.07 1.68*   Baseline Creatinine: 1  Impression/Assessment:  24yo left UPJ stone  Plan:  1. We discussed the natural hx of ureteral stones as well as the various treatment options including MET, perc, ureteroscopy, and ESWL. After discussing treatment options the patient wants to proceed with ESWL. He will be scheduled for tomorrow. NPO after midnight. KUB to confirm stone location  Meshach Perry L 08/24/2015, 5:58 PM

## 2015-08-24 NOTE — ED Notes (Signed)
Pt has hx of kidney stones.  Pt seen today for left flank pain.  Was seen yesterday and dx with kidney stone.  Told to return if condition becomes worse.  Pt n/v on assessment

## 2015-08-24 NOTE — ED Provider Notes (Signed)
CSN: [id number]     Arrival date & time 08/24/15  1153 History   First MD Initiated Contact with Patient 08/24/15 1200     Chief Complaint  Patient presents with  . Flank Pain      HPI Patient comes in complaining of flank pain.  Has known history of kidney stones and was scheduled for lithotripsy tomorrow.  Was seen by me yesterday at [facility].  At that time received fluids and had normal labs.  Continues have nausea vomiting and uncontrollable pain at home.  Denies any fever or chills. Past Medical History  Diagnosis Date  . Kidney stones   . Asthma   . 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 Ureteral stent placement  2012   History reviewed. No pertinent family history. Social History  Substance Use Topics  . Smoking status: Current Every Day Smoker -- 0.20 packs/day for 6 years    Types: Cigarettes  . Smokeless tobacco: Never Used  . Alcohol Use: No    Review of Systems  All other systems reviewed and are negative  Allergies  Bee venom  Home Medications   Prior to Admission medications   Medication Sig Start Date End Date Taking? Authorizing Provider  albuterol (PROVENTIL HFA;VENTOLIN HFA) 108 (90 BASE) MCG/ACT inhaler Inhale 2 puffs into the lungs every 6 (six) hours as needed for wheezing or shortness of breath. For shortness of breath and wheezing    Historical Provider, MD  cephALEXin (KEFLEX) 500 MG capsule Take 1 capsule (500 mg total) by mouth 4 (four) times daily. Patient not taking: Reported on 08/24/2015 03/20/15   April Palumbo, MD  EPINEPHrine (EPI-PEN) 0.3 mg/0.3 mL DEVI Inject 0.3 mg into the muscle daily as needed (Anaphylaxis). 11/01/12   Teressa Lower, NP  Fluticasone-Salmeterol (ADVAIR) 250-50 MCG/DOSE AEPB Inhale 1 puff into the lungs every 12 (twelve) hours as needed (Asthma).     Historical Provider, MD  naproxen (NAPROSYN) 500 MG tablet  Take 1 tablet (500 mg total) by mouth 2 (two) times daily with a meal. Patient not taking: Reported on 08/24/2015 07/11/15   Alvira Monday, MD  ondansetron (ZOFRAN) 4 MG tablet Take 1 tablet (4 mg total) by mouth every 6 (six) hours. 08/23/15   Nelva Nay, MD  oxyCODONE-acetaminophen (PERCOCET) 5-325 MG per tablet Take 1 tablet by mouth every 6 (six) hours as needed. 08/23/15   Nelva Nay, MD  tamsulosin (FLOMAX) 0.4 MG CAPS capsule Take 1 capsule (0.4 mg total) by mouth daily. Patient not taking: Reported on 08/24/2015 07/11/15   Alvira Monday, MD   BP 132/77 mmHg  Pulse 83  Temp(Src) 97.8 F (36.6 C) (Oral)  Resp 18  SpO2 100% Physical Exam  Constitutional: He is oriented to person, place, and time. He appears well-developed and well-nourished. He appears distressed (Appears uncomfortable because of flank pain.).  HENT:  Head: Normocephalic and atraumatic.  Eyes: Pupils are equal, round, and reactive to light.  Neck: Normal range of motion.  Cardiovascular: Normal rate and intact distal pulses.   Pulmonary/Chest: No respiratory distress.  Abdominal: Normal appearance. He exhibits no distension. There is no tenderness.  Musculoskeletal: Normal range of motion.  Neurological: He is alert and oriented to person, place, and time. No cranial nerve deficit.  Skin: Skin is warm and dry. No rash noted.  Psychiatric: He has a normal mood and affect. His  behavior is normal.  Nursing note and vitals reviewed.   ED Course  Procedures (including critical care time) Medications  fentaNYL (SUBLIMAZE) injection 50 mcg (not administered)  sodium chloride 0.9 % bolus 1,000 mL (not administered)  sodium chloride 0.9 % bolus 1,000 mL (0 mLs Intravenous Stopped 08/24/15 1317)  fentaNYL (SUBLIMAZE) injection 100 mcg (100 mcg Intravenous Given 08/24/15 1214)  ondansetron (ZOFRAN) injection 4 mg (4 mg Intravenous Given 08/24/15 1216)    Labs Review Labs Reviewed  CBC WITH DIFFERENTIAL/PLATELET -  Abnormal; Notable for the following:    WBC 12.7 (*)    RBC 4.10 (*)    Hemoglobin 12.5 (*)    HCT 37.2 (*)    Neutrophils Relative % 78 (*)    Neutro Abs 9.8 (*)    Monocytes Absolute 1.1 (*)    All other components within normal limits  BASIC METABOLIC PANEL - Abnormal; Notable for the following:    Glucose, Bld 116 (*)    Creatinine, Ser 1.68 (*)    GFR calc non Af Amer 55 (*)    All other components within normal limits  URINALYSIS, ROUTINE W REFLEX MICROSCOPIC (NOT AT [facility])    Imaging Review No results found. I have personally reviewed and evaluated these images and lab results as part of my medical decision-making.   Discussed the case with urology.  Patient be admitted for pain control and lithotripsy. MDM   Final diagnoses:  Kidney stone        Nelva Nay, MD 09/06/15 1343

## 2015-08-25 ENCOUNTER — Encounter (HOSPITAL_COMMUNITY)
Admission: EM | Disposition: A | Discharge status: Home / Self Care | Payer: Self-pay | Source: Home / Self Care | Attending: Urology

## 2015-08-25 ENCOUNTER — Other Ambulatory Visit: Payer: Self-pay | Admitting: Urology

## 2015-08-25 SURGERY — LITHOTRIPSY, ESWL
Anesthesia: LOCAL | Laterality: Left

## 2015-08-25 MED ORDER — POLYETHYLENE GLYCOL 3350 17 G PO PACK
17.0000 g | PACK | Freq: Two times a day (BID) | ORAL | Status: DC
  Administered 2015-08-25 – 2015-08-26 (×3): 17 g via ORAL
  Filled 2015-08-25 (×3): qty 1

## 2015-08-25 MED ORDER — SODIUM CHLORIDE 0.9 % IV SOLN: INTRAVENOUS | Status: AC

## 2015-08-25 MED ORDER — TAMSULOSIN HCL 0.4 MG PO CAPS
0.4000 mg | ORAL_CAPSULE | Freq: Every day | ORAL | Status: DC
  Administered 2015-08-25: 0.4 mg via ORAL
  Filled 2015-08-25: qty 1

## 2015-08-26 MED ORDER — TAMSULOSIN HCL 0.4 MG PO CAPS: 0.4000 mg | ORAL_CAPSULE | Freq: Every day | ORAL | Status: DC

## 2015-08-26 MED ORDER — ONDANSETRON HCL 4 MG PO TABS: 4.0000 mg | ORAL_TABLET | Freq: Four times a day (QID) | ORAL | Status: DC

## 2015-08-26 MED ORDER — HYDROMORPHONE HCL 4 MG PO TABS: 4.0000 mg | ORAL_TABLET | Freq: Four times a day (QID) | ORAL | Status: DC | PRN

## 2015-08-26 NOTE — Discharge Instructions (Signed)
Lithotripsy for Kidney Stones °Lithotripsy is a treatment that can sometimes help eliminate kidney stones and pain that they cause. A form of lithotripsy, also known as extracorporeal shock wave lithotripsy, is a nonsurgical procedure that helps your body rid itself of the kidney stone when it is too big to pass on its own. Extracorporeal shock wave lithotripsy is a method of crushing a kidney stone with shock waves. These shock waves pass through your body and are focused on your stone. They cause the kidney stones to crumble while still in the urinary tract. It is then easier for the smaller pieces of stone to pass in the urine. °Lithotripsy usually takes about an hour. It is done in a hospital, a lithotripsy center, or a mobile unit. It usually does not require an overnight stay. Your health care provider will instruct you on preparation for the procedure. Your health care provider will tell you what to expect afterward. °LET YOUR HEALTH CARE PROVIDER KNOW ABOUT: °· Any allergies you have. °· All medicines you are taking, including vitamins, herbs, eye drops, creams, and over-the-counter medicines. °· Previous problems you or members of your family have had with the use of anesthetics. °· Any blood disorders you have. °· Previous surgeries you have had. °· Medical conditions you have. °RISKS AND COMPLICATIONS °Generally, lithotripsy for kidney stones is a safe procedure. However, as with any procedure, complications can occur. Possible complications include: °· Infection. °· Bleeding of the kidney. °· Bruising of the kidney or skin. °· Obstruction of the ureter. °· Failure of the stone to fragment. °BEFORE THE PROCEDURE °· Do not eat or drink for 6-8 hours prior to the procedure. You may, however, take the medications with a sip of water that your physician instructs you to take °· Do not take aspirin or aspirin-containing products for 7 days prior to your procedure °· Do not take nonsteroidal anti-inflammatory  products for 7 days prior to your procedure °PROCEDURE °A stent (flexible tube with holes) may be placed in your ureter. The ureter is the tube that transports the urine from the kidneys to the bladder. Your health care provider may place a stent before the procedure. This will help keep urine flowing from the kidney if the fragments of the stone block the ureter. You may have an IV tube placed in one of your veins to give you fluids and medicines. These medicines may help you relax or make you sleep. During the procedure, you will lie comfortably on a fluid-filled cushion or in a warm-water bath. After an X-ray or ultrasound exam to locate your stone, shock waves are aimed at the stone. If you are awake, you may feel a tapping sensation as the shock waves pass through your body. If large stone particles remain after treatment, a second procedure may be necessary at a later date. °For comfort during the test: °· Relax as much as possible. °· Try to remain still as much as possible. °· Try to follow instructions to speed up the test. °· Let your health care provider know if you are uncomfortable, anxious, or in pain. °AFTER THE PROCEDURE  °After surgery, you will be taken to the recovery area. A nurse will watch and check your progress. Once you're awake, stable, and taking fluids well, you will be allowed to go home as long as there are no problems. You will also be allowed to pass your urine before discharge. You may be given antibiotics to help prevent infection. You may also be prescribed   pain medicine if needed. In a week or two, your health care provider may remove your stent, if you have one. You may first have an X-ray exam to check on how successful the fragmentation of your stone has been and how much of the stone has passed. Your health care provider will check to see whether or not stone particles remain. °SEEK IMMEDIATE MEDICAL CARE IF: °· You develop a fever or shaking chills. °· Your pain is not  relieved by medicine. °· You feel sick to your stomach (nauseated) and you vomit. °· You develop heavy bleeding. °· You have difficulty urinating. °· You start to pass your stent from your penis. °Document Released: 12/10/2000 Document Revised: 10/03/2013 Document Reviewed: 06/28/2013 °ExitCare® Patient Information ©2015 [employer]. This information is not intended to replace advice given to you by your health care provider. Make sure you discuss any questions you have with your health care provider. ° °

## 2015-08-27 NOTE — Discharge Summary (Signed)
Physician Discharge Summary  Patient ID: Nathan Tanner MRN: [id number] DOB/AGE: Nov 30, 1991 24 y.o.  Admit date: 08/24/2015 Discharge date: 08/26/2015  Admission Diagnoses: left ureteral stone  Discharge Diagnoses:  Active Problems:   Kidney stone   Ureteral stone   Discharged Condition: good  Hospital Course: The patient was admitted for pain control for his 9mm left ureteral stone. On hospital day #1 he underwent Left ESWL. The patient tolerated the procedure well and was transferred to the floor on IV pain meds, IV fluid. On POD#1 the patient was transitioned to a regular diet, IVFs were discontinued, and the patient passed flatus. Prior to discharge the pt was tolerating a regular diet, pain was controlled on PO pain meds, they were ambulating without difficulty, and they had normal bowel function.   Consults: None  Significant Diagnostic Studies: [facility]  Treatments: surgery: Left ESWL  Discharge Exam: Blood pressure 121/72, pulse 91, temperature 98 F (36.7 C), temperature source Oral, resp. rate 16, height  (1.854 m), weight 63.322 kg (139 lb 9.6 oz), SpO2 96 %. General appearance: alert, cooperative and appears stated age Head: Normocephalic, without obvious abnormality, atraumatic Ears: normal TM's and external ear canals both ears Neck: no adenopathy, no carotid bruit, no JVD, supple, symmetrical, trachea midline and thyroid not enlarged, symmetric, no tenderness/mass/nodules Resp: clear to auscultation bilaterally Cardio: regular rate and rhythm, S1, S2 normal, no murmur, click, rub or gallop GI: soft, non-tender; bowel sounds normal; no masses,  no organomegaly Extremities: extremities normal, atraumatic, no cyanosis or edema Skin: Skin color, texture, turgor normal. No rashes or lesions  Disposition: 01-Home or Self Care     Medication List    TAKE these medications        albuterol 108 (90 BASE) MCG/ACT inhaler  Commonly known as:  PROVENTIL HFA;VENTOLIN HFA   Inhale 2 puffs into the lungs every 6 (six) hours as needed for wheezing or shortness of breath. For shortness of breath and wheezing     cephALEXin 500 MG capsule  Commonly known as:  KEFLEX  Take 1 capsule (500 mg total) by mouth 4 (four) times daily.     EPINEPHrine 0.3 mg/0.3 mL Devi  Commonly known as:  EPI-PEN  Inject 0.3 mg into the muscle daily as needed (Anaphylaxis).     Fluticasone-Salmeterol 250-50 MCG/DOSE Aepb  Commonly known as:  ADVAIR  Inhale 1 puff into the lungs every 12 (twelve) hours as needed (Asthma).     HYDROmorphone 4 MG tablet  Commonly known as:  DILAUDID  Take 1 tablet (4 mg total) by mouth every 6 (six) hours as needed for moderate pain or severe pain.     naproxen 500 MG tablet  Commonly known as:  NAPROSYN  Take 1 tablet (500 mg total) by mouth 2 (two) times daily with a meal.     ondansetron 4 MG tablet  Commonly known as:  ZOFRAN  Take 1 tablet (4 mg total) by mouth every 6 (six) hours.     oxyCODONE-acetaminophen 5-325 MG per tablet  Commonly known as:  PERCOCET  Take 1 tablet by mouth every 6 (six) hours as needed.     tamsulosin 0.4 MG Caps capsule  Commonly known as:  FLOMAX  Take 1 capsule (0.4 mg total) by mouth daily.           Follow-up Information    Follow up with Daulton Harbaugh L, MD. Call in 2 weeks.   Specialty:  Urology   Why:  with [facility]  Contact information:   [address] [place] [address] [phone]       Signed: Malen Gauze 08/27/2015, 6:47 PM

## 2015-10-18 ENCOUNTER — Emergency Department (HOSPITAL_BASED_OUTPATIENT_CLINIC_OR_DEPARTMENT_OTHER): Payer: Federal, State, Local not specified - PPO

## 2015-10-18 ENCOUNTER — Encounter (HOSPITAL_BASED_OUTPATIENT_CLINIC_OR_DEPARTMENT_OTHER): Payer: Self-pay | Admitting: Emergency Medicine

## 2015-10-18 ENCOUNTER — Emergency Department (HOSPITAL_BASED_OUTPATIENT_CLINIC_OR_DEPARTMENT_OTHER)
Admission: EM | Admit: 2015-10-18 | Discharge: 2015-10-18 | Disposition: A | Discharge status: Home / Self Care | Payer: Federal, State, Local not specified - PPO | Attending: Emergency Medicine | Admitting: Emergency Medicine

## 2015-10-18 DIAGNOSIS — R39198 Other difficulties with micturition: Secondary | ICD-10-CM | POA: Insufficient documentation

## 2015-10-18 DIAGNOSIS — Z87442 Personal history of urinary calculi: Secondary | ICD-10-CM | POA: Insufficient documentation

## 2015-10-18 DIAGNOSIS — R109 Unspecified abdominal pain: Secondary | ICD-10-CM | POA: Insufficient documentation

## 2015-10-18 DIAGNOSIS — R6883 Chills (without fever): Secondary | ICD-10-CM | POA: Insufficient documentation

## 2015-10-18 DIAGNOSIS — R112 Nausea with vomiting, unspecified: Secondary | ICD-10-CM | POA: Insufficient documentation

## 2015-10-18 DIAGNOSIS — R42 Dizziness and giddiness: Secondary | ICD-10-CM | POA: Insufficient documentation

## 2015-10-18 DIAGNOSIS — J45909 Unspecified asthma, uncomplicated: Secondary | ICD-10-CM | POA: Insufficient documentation

## 2015-10-18 DIAGNOSIS — R51 Headache: Secondary | ICD-10-CM | POA: Insufficient documentation

## 2015-10-18 DIAGNOSIS — R61 Generalized hyperhidrosis: Secondary | ICD-10-CM | POA: Insufficient documentation

## 2015-10-18 DIAGNOSIS — R197 Diarrhea, unspecified: Secondary | ICD-10-CM | POA: Insufficient documentation

## 2015-10-18 DIAGNOSIS — Z720 Tobacco use: Secondary | ICD-10-CM | POA: Insufficient documentation

## 2015-10-18 DIAGNOSIS — Z79899 Other long term (current) drug therapy: Secondary | ICD-10-CM | POA: Insufficient documentation

## 2015-10-18 LAB — URINALYSIS, ROUTINE W REFLEX MICROSCOPIC
BILIRUBIN URINE: NEGATIVE
Glucose, UA: NEGATIVE mg/dL
KETONES UR: NEGATIVE mg/dL
NITRITE: NEGATIVE
Protein, ur: NEGATIVE mg/dL
Specific Gravity, Urine: 1.023 (ref 1.005–1.030)
UROBILINOGEN UA: 1 mg/dL (ref 0.0–1.0)
pH: 6.5 (ref 5.0–8.0)

## 2015-10-18 LAB — URINE MICROSCOPIC-ADD ON

## 2015-10-18 LAB — CBC WITH DIFFERENTIAL/PLATELET
BASOS ABS: 0 10*3/uL (ref 0.0–0.1)
BASOS PCT: 0 %
EOS ABS: 0.1 10*3/uL (ref 0.0–0.7)
EOS PCT: 1 %
HCT: 38.8 % — ABNORMAL LOW (ref 39.0–52.0)
Hemoglobin: 12.8 g/dL — ABNORMAL LOW (ref 13.0–17.0)
Lymphocytes Relative: 30 %
Lymphs Abs: 2.1 10*3/uL (ref 0.7–4.0)
MCH: 29.8 pg (ref 26.0–34.0)
MCHC: 33 g/dL (ref 30.0–36.0)
MCV: 90.2 fL (ref 78.0–100.0)
MONO ABS: 0.7 10*3/uL (ref 0.1–1.0)
Monocytes Relative: 9 %
Neutro Abs: 4.2 10*3/uL (ref 1.7–7.7)
Neutrophils Relative %: 60 %
PLATELETS: 232 10*3/uL (ref 150–400)
RBC: 4.3 MIL/uL (ref 4.22–5.81)
RDW: 12.1 % (ref 11.5–15.5)
WBC: 7 10*3/uL (ref 4.0–10.5)

## 2015-10-18 LAB — BASIC METABOLIC PANEL
ANION GAP: 4 — AB (ref 5–15)
BUN: 17 mg/dL (ref 6–20)
CALCIUM: 9.4 mg/dL (ref 8.9–10.3)
CO2: 31 mmol/L (ref 22–32)
Chloride: 105 mmol/L (ref 101–111)
Creatinine, Ser: 1.1 mg/dL (ref 0.61–1.24)
Glucose, Bld: 86 mg/dL (ref 65–99)
Potassium: 3.2 mmol/L — ABNORMAL LOW (ref 3.5–5.1)
Sodium: 140 mmol/L (ref 135–145)

## 2015-10-18 MED ORDER — NAPROXEN 500 MG PO TABS: 500.0000 mg | ORAL_TABLET | Freq: Two times a day (BID) | ORAL | Status: DC

## 2015-10-18 MED ORDER — HYDROCODONE-ACETAMINOPHEN 5-325 MG PO TABS: 1.0000 | ORAL_TABLET | Freq: Four times a day (QID) | ORAL | Status: DC | PRN

## 2015-10-18 MED ORDER — ONDANSETRON HCL 4 MG/2ML IJ SOLN
4.0000 mg | Freq: Once | INTRAMUSCULAR | Status: AC
  Administered 2015-10-18: 4 mg via INTRAVENOUS
  Filled 2015-10-18: qty 2

## 2015-10-18 MED ORDER — SODIUM CHLORIDE 0.9 % IV SOLN
INTRAVENOUS | Status: DC
  Administered 2015-10-18: 18:00:00 via INTRAVENOUS

## 2015-10-18 MED ORDER — ONDANSETRON 4 MG PO TBDP: 4.0000 mg | ORAL_TABLET | Freq: Three times a day (TID) | ORAL | Status: DC | PRN

## 2015-10-18 MED ORDER — HYDROMORPHONE HCL 1 MG/ML IJ SOLN
1.0000 mg | Freq: Once | INTRAMUSCULAR | Status: AC
  Administered 2015-10-18: 1 mg via INTRAVENOUS
  Filled 2015-10-18: qty 1

## 2015-10-18 NOTE — ED Provider Notes (Signed)
CSN: [id number]     Arrival date & time 10/18/15  1615 History  By signing my name below, I, Ronney Lion, attest that this documentation has been prepared under the direction and in the presence of Vanetta Mulders, MD. Electronically Signed: Ronney Lion, ED Scribe. 10/18/2015. 6:24 PM.   Chief Complaint  Patient presents with  . Flank Pain   Patient is a 24 y.o. male presenting with abdominal pain. The history is provided by the patient. No language interpreter was used.  Abdominal Pain Pain location:  R flank Pain radiates to:  Does not radiate Pain severity:  Severe Onset quality:  Gradual Duration:  1 day Timing:  Constant Progression:  Worsening Chronicity:  Chronic Relieved by:  None tried Worsened by:  Nothing tried Ineffective treatments:  None tried Associated symptoms: chills, diarrhea, nausea and vomiting   Associated symptoms: no chest pain, no cough, no dysuria, no fever, no hematuria and no sore throat    HPI Comments: Nathan Tanner is a 24 y.o. male with a history of kidney stones, who presents to the Emergency Department complaining of constant, 9/10 right flank pain that has been present for "a while" but acutely worsened yesterday. Associated symptoms include nausea, vomiting, chills, diaphoresis, diarrhea, difficulty urinating, headache, and dizziness. Patient reports he had lithotripsy 1 month ago at [facility] for a stone in his left kidney.  He denies fevers, visual changes, cough, rhinorrhea, sore throat, chest pain, SOB, dysuria, hematuria, leg swelling, bleeding easily, or rash. Urologist: Dr. Annabell Howells  Past Medical History  Diagnosis Date  . Kidney stones   . Asthma   . 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 Ureteral stent placement  2012   History reviewed. No pertinent family history. Social History  Substance Use Topics  . Smoking status: Current Every Day  Smoker -- 0.20 packs/day for 6 years    Types: Cigarettes  . Smokeless tobacco: Never Used  . Alcohol Use: No    Review of Systems  Constitutional: Positive for chills and diaphoresis. Negative for fever.  HENT: Negative for rhinorrhea and sore throat.   Eyes: Negative for visual disturbance.  Respiratory: Negative for cough.   Cardiovascular: Negative for chest pain and leg swelling.  Gastrointestinal: Positive for nausea, vomiting, abdominal pain and diarrhea.  Genitourinary: Positive for flank pain and difficulty urinating. Negative for dysuria and hematuria.  Musculoskeletal: Negative for back pain.  Skin: Negative for rash.  Neurological: Positive for dizziness and headaches.  Hematological: Does not bruise/bleed easily.  All other systems reviewed and are negative.  Allergies  Bee venom  Home Medications   Prior to Admission medications   Medication Sig Start Date End Date Taking? Authorizing Provider  albuterol (PROVENTIL HFA;VENTOLIN HFA) 108 (90 BASE) MCG/ACT inhaler Inhale 2 puffs into the lungs every 6 (six) hours as needed for wheezing or shortness of breath. For shortness of breath and wheezing    Historical Provider, MD  EPINEPHrine (EPI-PEN) 0.3 mg/0.3 mL DEVI Inject 0.3 mg into the muscle daily as needed (Anaphylaxis). 11/01/12   Teressa Lower, NP  Fluticasone-Salmeterol (ADVAIR) 250-50 MCG/DOSE AEPB Inhale 1 puff into the lungs every 12 (twelve) hours as needed (Asthma).     Historical Provider, MD  HYDROcodone-acetaminophen (NORCO/VICODIN) 5-325 MG tablet Take 1-2 tablets by mouth every 6 (six) hours as needed for moderate pain. 10/18/15   Vanetta Mulders, MD  HYDROmorphone (DILAUDID)  4 MG tablet Take 1 tablet (4 mg total) by mouth every 6 (six) hours as needed for moderate pain or severe pain. 08/26/15   Malen GauzePatrick L McKenzie, MD  naproxen (NAPROSYN) 500 MG tablet Take 1 tablet (500 mg total) by mouth 2 (two) times daily with a meal. Patient not taking: Reported on  08/24/2015 07/11/15   Alvira MondayErin Schlossman, MD  naproxen (NAPROSYN) 500 MG tablet Take 1 tablet (500 mg total) by mouth 2 (two) times daily. 10/18/15   Vanetta MuldersScott Wataru Mccowen, MD  ondansetron (ZOFRAN ODT) 4 MG disintegrating tablet Take 1 tablet (4 mg total) by mouth every 8 (eight) hours as needed for nausea or vomiting. 10/18/15   Vanetta MuldersScott Gabbrielle Mcnicholas, MD  ondansetron (ZOFRAN) 4 MG tablet Take 1 tablet (4 mg total) by mouth every 6 (six) hours. 08/26/15   Malen GauzePatrick L McKenzie, MD  oxyCODONE-acetaminophen (PERCOCET) 5-325 MG per tablet Take 1 tablet by mouth every 6 (six) hours as needed. Patient not taking: Reported on 08/24/2015 08/23/15   Nelva Nayobert Beaton, MD  tamsulosin (FLOMAX) 0.4 MG CAPS capsule Take 1 capsule (0.4 mg total) by mouth daily. 08/26/15   Malen GauzePatrick L McKenzie, MD   BP 98/58 mmHg  Pulse 64  Temp(Src) 97.9 F (36.6 C) (Oral)  Resp 18  Ht 5\' 11"  (1.803 m)  Wt 145 lb (65.772 kg)  BMI 20.23 kg/m2  SpO2 99% Physical Exam  Constitutional: He is oriented to person, place, and time. He appears well-developed and well-nourished. No distress.  HENT:  Head: Normocephalic and atraumatic.  Mouth/Throat: Oropharynx is clear and moist.  Mucous membranes are moist.   Eyes: Conjunctivae and EOM are normal. Pupils are equal, round, and reactive to light. No scleral icterus.  Pupils are normal. Sclera is clear. Eyes track normal.  Neck: Neck supple. No tracheal deviation present.  Cardiovascular: Normal rate and regular rhythm.   Pulmonary/Chest: Effort normal and breath sounds normal. No respiratory distress. He has no wheezes. He has no rales.  Lungs are clear to auscultation bilaterally.  Abdominal: Soft. Bowel sounds are normal. He exhibits no distension. There is no tenderness.  Abdomen is flat and non-tender.  Musculoskeletal: Normal range of motion. He exhibits no edema.  No BLE swelling.  Neurological: He is alert and oriented to person, place, and time.  Skin: Skin is warm and dry.  Psychiatric: He  has a normal mood and affect. His behavior is normal.  Nursing note and vitals reviewed.   ED Course  Procedures (including critical care time)  DIAGNOSTIC STUDIES: Oxygen Saturation is 97% on RA, normal by my interpretation.    COORDINATION OF CARE: 6:24 PM - Discussed treatment plan with pt at bedside which includes pain and nausea medication. Pt verbalized understanding and agreed to plan.   Labs Review Labs Reviewed  URINALYSIS, ROUTINE W REFLEX MICROSCOPIC (NOT [facility]) - Abnormal; Notable for the following:    APPearance CLOUDY (*)    Hgb urine dipstick TRACE (*)    Leukocytes, UA SMALL (*)    All other components within normal limits  URINE MICROSCOPIC-ADD ON - Abnormal; Notable for the following:    Bacteria, UA MANY (*)    All other components within normal limits  CBC WITH DIFFERENTIAL/PLATELET - Abnormal; Notable for the following:    Hemoglobin 12.8 (*)    HCT 38.8 (*)    All other components within normal limits  BASIC METABOLIC PANEL - Abnormal; Notable for the following:    Potassium 3.2 (*)    Anion gap 4 (*)  All other components within normal limits   Results for orders placed or performed during the hospital encounter of 10/18/15  Urinalysis, Routine w reflex microscopic (not at [facility])  Result Value Ref Range   Color, Urine YELLOW YELLOW   APPearance CLOUDY (A) CLEAR   Specific Gravity, Urine 1.023 1.005 - 1.030   pH 6.5 5.0 - 8.0   Glucose, UA NEGATIVE NEGATIVE mg/dL   Hgb urine dipstick TRACE (A) NEGATIVE   Bilirubin Urine NEGATIVE NEGATIVE   Ketones, ur NEGATIVE NEGATIVE mg/dL   Protein, ur NEGATIVE NEGATIVE mg/dL   Urobilinogen, UA 1.0 0.0 - 1.0 mg/dL   Nitrite NEGATIVE NEGATIVE   Leukocytes, UA SMALL (A) NEGATIVE  Urine microscopic-add on  Result Value Ref Range   Squamous Epithelial / LPF RARE RARE   WBC, UA 3-6 <3 WBC/hpf   RBC / HPF 3-6 <3 RBC/hpf   Bacteria, UA MANY (A) RARE   Urine-Other MUCOUS PRESENT   CBC with  Differential/Platelet  Result Value Ref Range   WBC 7.0 4.0 - 10.5 K/uL   RBC 4.30 4.22 - 5.81 MIL/uL   Hemoglobin 12.8 (L) 13.0 - 17.0 g/dL   HCT 16.1 (L) 09.6 - 04.5 %   MCV 90.2 78.0 - 100.0 fL   MCH 29.8 26.0 - 34.0 pg   MCHC 33.0 30.0 - 36.0 g/dL   RDW 40.9 81.1 - 91.4 %   Platelets 232 150 - 400 K/uL   Neutrophils Relative % 60 %   Neutro Abs 4.2 1.7 - 7.7 K/uL   Lymphocytes Relative 30 %   Lymphs Abs 2.1 0.7 - 4.0 K/uL   Monocytes Relative 9 %   Monocytes Absolute 0.7 0.1 - 1.0 K/uL   Eosinophils Relative 1 %   Eosinophils Absolute 0.1 0.0 - 0.7 K/uL   Basophils Relative 0 %   Basophils Absolute 0.0 0.0 - 0.1 K/uL  Basic metabolic panel  Result Value Ref Range   Sodium 140 135 - 145 mmol/L   Potassium 3.2 (L) 3.5 - 5.1 mmol/L   Chloride 105 101 - 111 mmol/L   CO2 31 22 - 32 mmol/L   Glucose, Bld 86 65 - 99 mg/dL   BUN 17 6 - 20 mg/dL   Creatinine, Ser 7.82 0.61 - 1.24 mg/dL   Calcium 9.4 8.9 - 95.6 mg/dL   GFR calc non Af Amer >60 >60 mL/min   GFR calc Af Amer >60 >60 mL/min   Anion gap 4 (L) 5 - 15     Imaging Review Ct Renal Stone Study  10/18/2015  CLINICAL DATA:  Bilateral flank pain. EXAM: CT ABDOMEN AND PELVIS WITHOUT CONTRAST TECHNIQUE: Multidetector CT imaging of the abdomen and pelvis was performed following the standard protocol without IV contrast. COMPARISON:  CT scan of July 11, 2015. FINDINGS: Visualized lung bases are unremarkable. No significant osseous abnormality is noted. No gallstones are noted. No focal abnormality is noted in the liver, spleen or pancreas on these unenhanced images. Adrenal glands appear normal. 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 Smaller calculi are noted in the lower pole collecting system. Left kidney appears normal. No hydronephrosis or renal obstruction is noted. There is no evidence of bowel obstruction. No abnormal fluid collection is noted. No significant adenopathy is  noted. Urinary bladder is decompressed. The appendix appears normal. IMPRESSION: Nonobstructive right renal calculi. No hydronephrosis or renal obstruction is noted. Electronically Signed   By: Lupita Raider, M.D.  On: 10/18/2015 18:16   I have personally reviewed and evaluated these images and lab results as part of my medical decision-making.   EKG Interpretation None      MDM   Final diagnoses:  Flank pain   No findings on CT scan to explain the right flank pain. No evidence of obstructing ureteral stone. Rest of scan without any significant abnormalities. Patient will be treated symptomatically.  I, Anhelica Fowers, personally performed the services described in this documentation. All medical record entries made by the scribe were at my direction and in my presence.  I have reviewed the chart and discharge instructions and agree that the record reflects my personal performance and is accurate and complete. Letishia Elliott.  10/18/2015. 6:24 PM.       Vanetta Mulders, MD 10/18/15 1825

## 2015-10-18 NOTE — ED Notes (Signed)
Pt states he has had kidney stones for months,  He reports that we referred him to urologist months ago and was evaluated and treated then for stone in right kidney but pain did not improve. Reports pain to left kidney now

## 2015-10-18 NOTE — ED Notes (Signed)
Returns from radiology, placed back on cont POX monitoring with int NBP, safety measures in place, pt instructed not to get up off stretcher w/o nsg staff assistance, secondary to being medicated per EDP orders

## 2015-10-18 NOTE — Discharge Instructions (Signed)
Workup for the right flank pain shows no evidence of any obstructing ureteral stone. There is evidence of a stone up in the right kidney that would not be causing any symptoms. Take the medications as directed. Take the Naprosyn on a regular basis. Take Zofran as needed for nausea and vomiting. Supplement with hydrocodone as needed for additional pain control. Make an appointment to follow-up with your urologist.

## 2015-10-18 NOTE — ED Notes (Signed)
Presents today with c/o rt sided flank pain, states pain started yesterday, pt states has hx of kidney stones on Rt and Left side, pt states having a lot of N/V, appetite poor

## 2015-10-18 NOTE — ED Notes (Signed)
Pt placed on cont POX with int NBP monitoring

## 2015-10-18 NOTE — ED Notes (Signed)
Pt repositioned for comfort

## 2015-10-18 NOTE — ED Notes (Signed)
Patient transported to CT via stretcher, sr x 2 up  

## 2015-10-30 ENCOUNTER — Emergency Department (HOSPITAL_COMMUNITY)
Admission: EM | Admit: 2015-10-30 | Discharge: 2015-10-30 | Disposition: A | Discharge status: Home / Self Care | Payer: Federal, State, Local not specified - PPO | Attending: Emergency Medicine | Admitting: Emergency Medicine

## 2015-10-30 ENCOUNTER — Emergency Department (HOSPITAL_COMMUNITY): Payer: Federal, State, Local not specified - PPO

## 2015-10-30 ENCOUNTER — Encounter (HOSPITAL_COMMUNITY): Payer: Self-pay | Admitting: Emergency Medicine

## 2015-10-30 DIAGNOSIS — Z720 Tobacco use: Secondary | ICD-10-CM | POA: Insufficient documentation

## 2015-10-30 DIAGNOSIS — Z87442 Personal history of urinary calculi: Secondary | ICD-10-CM | POA: Insufficient documentation

## 2015-10-30 DIAGNOSIS — J45909 Unspecified asthma, uncomplicated: Secondary | ICD-10-CM | POA: Insufficient documentation

## 2015-10-30 DIAGNOSIS — R109 Unspecified abdominal pain: Secondary | ICD-10-CM | POA: Insufficient documentation

## 2015-10-30 DIAGNOSIS — M546 Pain in thoracic spine: Secondary | ICD-10-CM | POA: Insufficient documentation

## 2015-10-30 DIAGNOSIS — M898X1 Other specified disorders of bone, shoulder: Secondary | ICD-10-CM

## 2015-10-30 DIAGNOSIS — Z79899 Other long term (current) drug therapy: Secondary | ICD-10-CM | POA: Insufficient documentation

## 2015-10-30 DIAGNOSIS — R319 Hematuria, unspecified: Secondary | ICD-10-CM | POA: Insufficient documentation

## 2015-10-30 LAB — URINALYSIS, ROUTINE W REFLEX MICROSCOPIC
BILIRUBIN URINE: NEGATIVE
Glucose, UA: NEGATIVE mg/dL
Ketones, ur: NEGATIVE mg/dL
NITRITE: NEGATIVE
PH: 6 (ref 5.0–8.0)
Protein, ur: NEGATIVE mg/dL
SPECIFIC GRAVITY, URINE: 1.024 (ref 1.005–1.030)
Urobilinogen, UA: 0.2 mg/dL (ref 0.0–1.0)

## 2015-10-30 LAB — BASIC METABOLIC PANEL
Anion gap: 5 (ref 5–15)
BUN: 16 mg/dL (ref 6–20)
CHLORIDE: 107 mmol/L (ref 101–111)
CO2: 29 mmol/L (ref 22–32)
CREATININE: 1.19 mg/dL (ref 0.61–1.24)
Calcium: 9.6 mg/dL (ref 8.9–10.3)
GFR calc non Af Amer: >60 mL/min (ref >60)
GLUCOSE: 103 mg/dL — AB (ref 65–99)
Potassium: 3.6 mmol/L (ref 3.5–5.1)
Sodium: 141 mmol/L (ref 135–145)

## 2015-10-30 LAB — URINE MICROSCOPIC-ADD ON

## 2015-10-30 LAB — CBC
HEMATOCRIT: 41 % (ref 39.0–52.0)
HEMOGLOBIN: 13.4 g/dL (ref 13.0–17.0)
MCH: 29.9 pg (ref 26.0–34.0)
MCHC: 32.7 g/dL (ref 30.0–36.0)
MCV: 91.5 fL (ref 78.0–100.0)
Platelets: 254 10*3/uL (ref 150–400)
RBC: 4.48 MIL/uL (ref 4.22–5.81)
RDW: 12.6 % (ref 11.5–15.5)
WBC: 4.7 10*3/uL (ref 4.0–10.5)

## 2015-10-30 MED ORDER — METHOCARBAMOL 500 MG PO TABS: 500.0000 mg | ORAL_TABLET | Freq: Two times a day (BID) | ORAL | Status: DC

## 2015-10-30 MED ORDER — KETOROLAC TROMETHAMINE 60 MG/2ML IM SOLN
60.0000 mg | Freq: Once | INTRAMUSCULAR | Status: AC
  Administered 2015-10-30: 60 mg via INTRAMUSCULAR
  Filled 2015-10-30: qty 2

## 2015-10-30 MED ORDER — OXYCODONE-ACETAMINOPHEN 5-325 MG PO TABS
1.0000 | ORAL_TABLET | Freq: Once | ORAL | Status: AC
  Administered 2015-10-30: 1 via ORAL
  Filled 2015-10-30: qty 1

## 2015-10-30 MED ORDER — NAPROXEN 500 MG PO TABS: 500.0000 mg | ORAL_TABLET | Freq: Two times a day (BID) | ORAL | Status: DC

## 2015-10-30 NOTE — Progress Notes (Signed)
EDCM spoke to patient at bedside. Patient confirms he [place] a pcp [place]  [facility], informed patient of services there.  EDCM also provided patient with list of pcps who accept self pay patients, list of discount pharmacies and websites needymeds.org and GoodRX.com for medication assistance, phone number to inquire about the orange card, phone number to inquire about Mediciad, phone number to inquire about the Affordable Care Act, financial resources in the community such as local churches, [employer], urban ministries, and dental assistance for uninsured patients.  Patient thankful for resources.  No further EDCM needs at this time.  Patient reports he will have Medicaid soon.

## 2015-10-30 NOTE — ED Notes (Signed)
Patient c/o pain under the right shoulder blade that is constant 8/10 pain.  Patient states that has urinary frequency and has to strain to void, "thought it would be a stone" patient states has been getting "really hot and then chills".  Patient unsure if has been running a fever, "no thermometer at the house".

## 2015-10-30 NOTE — ED Notes (Signed)
UA reordered, due to Lab not being able to see the order.

## 2015-10-30 NOTE — ED Notes (Signed)
Pt states he's had problems with kidney stones x 5 years. Recent lithotripsy in 07/2015 to left flank/kidney. States his urologist told him to come back to the ER with any new/worsening pain. States 2 weeks prior was in the hospital for a new right sided flank pain, says he was given pain and nausea meds to take. The pain has continued/worsened into today, now says he's urinating blood and having "lime green" stool.

## 2015-10-30 NOTE — ED Notes (Signed)
Patient transported to X-ray 

## 2015-10-30 NOTE — Discharge Instructions (Signed)
Take naproxen for pain as prescribed. Robaxin for muscle spasms. GI heating pads and stretches. Follow with primary care doctor for recheck. Also follow-up with urologist for recheck of your intra-kidney stone.  Flank Pain Flank pain refers to pain that is located on the side of the body between the upper abdomen and the back. The pain may occur over a short period of time (acute) or may be long-term or reoccurring (chronic). It may be mild or severe. Flank pain can be caused by many things. CAUSES  Some of the more common causes of flank pain include:  Muscle strains.   Muscle spasms.   A disease of your spine (vertebral disk disease).   A lung infection (pneumonia).   Fluid around your lungs (pulmonary edema).   A kidney infection.   Kidney stones.   A very painful skin rash caused by the chickenpox virus (shingles).   Gallbladder disease.  HOME CARE INSTRUCTIONS  Home care will depend on the cause of your pain. In general,  Rest as directed by your caregiver.  Drink enough fluids to keep your urine clear or pale yellow.  Only take over-the-counter or prescription medicines as directed by your caregiver. Some medicines may help relieve the pain.  Tell your caregiver about any changes in your pain.  Follow up with your caregiver as directed. SEEK IMMEDIATE MEDICAL CARE IF:   Your pain is not controlled with medicine.   You have new or worsening symptoms.  Your pain increases.   You have abdominal pain.   You have shortness of breath.   You have persistent nausea or vomiting.   You have swelling in your abdomen.   You feel faint or pass out.   You have blood in your urine.  You have a fever or persistent symptoms for more than 2-3 days.  You have a fever and your symptoms suddenly get worse. MAKE SURE YOU:   Understand these instructions.  Will watch your condition.  Will get help right away if you are not doing well or get worse.     This information is not intended to replace advice given to you by your health care provider. Make sure you discuss any questions you have with your health care provider.   Document Released: 02/03/2006 Document Revised: 09/06/2012 Document Reviewed: 07/27/2012 Elsevier Interactive Patient Education [employer]2016 Elsevier Inc.

## 2015-10-30 NOTE — ED Provider Notes (Signed)
CSN: [id number]     Arrival date & time 10/30/15  1531 History   First MD Initiated Contact with Patient 10/30/15 1632     Chief Complaint  Patient presents with  . Flank Pain  . Hematuria     (Consider location/radiation/quality/duration/timing/severity/associated sxs/prior Treatment) HPI Nathan Tanner is a 24 y.o. male with history of kidney stones, asthma, presents to emergency department complaining of right upper back pain. Patient states he has had pain in that area for 2 weeks. He was seen here when it started, had negative CT abdomen and pelvis done except for a intrarenal stone. He was supposed to follow-up with urologist but he never did and he said that he was told to come to [facility] ED if his pain was worsening. He states he did not fill his prescription medications. He states his pain is not any worse is just not improving. He reports pain around right scapula, worse with movement of the right arm and palpation over that area. He denies any injuries. He denies any trouble urinating. He denies any fever or chills. He states his urine does look dark, which he attributes to blood.  Past Medical History  Diagnosis Date  . Kidney stones   . Asthma   . 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 Ureteral stent placement  2012   History reviewed. No pertinent family history. Social History  Substance Use Topics  . Smoking status: Current Every Day Smoker -- 0.20 packs/day for 6 years    Types: Cigarettes  . Smokeless tobacco: Never Used  . Alcohol Use: No    Review of Systems  Constitutional: Negative for fever and chills.  Respiratory: Negative for cough, chest tightness and shortness of breath.   Cardiovascular: Negative for chest pain, palpitations and leg swelling.  Gastrointestinal: Negative for nausea, vomiting, abdominal pain, diarrhea and abdominal distention.   Genitourinary: Positive for hematuria and flank pain. Negative for dysuria, urgency and frequency.  Musculoskeletal: Positive for myalgias. Negative for neck pain and neck stiffness.  Skin: Negative for rash.  Allergic/Immunologic: Negative for immunocompromised state.  Neurological: Negative for dizziness, weakness, light-headedness, numbness and headaches.  All other systems reviewed and are negative.     Allergies  Bee venom  Home Medications   Prior to Admission medications   Medication Sig Start Date End Date Taking? Authorizing Provider  albuterol (PROVENTIL HFA;VENTOLIN HFA) 108 (90 BASE) MCG/ACT inhaler Inhale 2 puffs into the lungs every 6 (six) hours as needed for wheezing or shortness of breath. For shortness of breath and wheezing   Yes Historical Provider, MD  EPINEPHrine (EPI-PEN) 0.3 mg/0.3 mL DEVI Inject 0.3 mg into the muscle daily as needed (Anaphylaxis). 11/01/12  Yes Teressa Lower, NP  Fluticasone-Salmeterol (ADVAIR) 250-50 MCG/DOSE AEPB Inhale 1 puff into the lungs every 12 (twelve) hours as needed (Asthma).    Yes Historical Provider, MD  HYDROcodone-acetaminophen (NORCO/VICODIN) 5-325 MG tablet Take 1-2 tablets by mouth every 6 (six) hours as needed for moderate pain. Patient not taking: Reported on 10/30/2015 10/18/15   Vanetta Mulders, MD  HYDROmorphone (DILAUDID) 4 MG tablet Take 1 tablet (4 mg total) by mouth every 6 (six) hours as needed for moderate pain or severe pain. Patient not taking: Reported on 10/30/2015 08/26/15   Malen Gauze, MD  naproxen (NAPROSYN) 500 MG tablet Take 1 tablet (500 mg total) by mouth 2 (two)  times daily with a meal. Patient not taking: Reported on 08/24/2015 07/11/15   Alvira Monday, MD  naproxen (NAPROSYN) 500 MG tablet Take 1 tablet (500 mg total) by mouth 2 (two) times daily. Patient not taking: Reported on 10/30/2015 10/18/15   Vanetta Mulders, MD  ondansetron (ZOFRAN ODT) 4 MG disintegrating tablet Take 1 tablet (4 mg  total) by mouth every 8 (eight) hours as needed for nausea or vomiting. Patient not taking: Reported on 10/30/2015 10/18/15   Vanetta Mulders, MD  ondansetron (ZOFRAN) 4 MG tablet Take 1 tablet (4 mg total) by mouth every 6 (six) hours. Patient not taking: Reported on 10/30/2015 08/26/15   Malen Gauze, MD  oxyCODONE-acetaminophen (PERCOCET) 5-325 MG per tablet Take 1 tablet by mouth every 6 (six) hours as needed. Patient not taking: Reported on 08/24/2015 08/23/15   Nelva Nay, MD  tamsulosin (FLOMAX) 0.4 MG CAPS capsule Take 1 capsule (0.4 mg total) by mouth daily. Patient not taking: Reported on 10/30/2015 08/26/15   Malen Gauze, MD   BP 109/56 mmHg  Pulse 100  Temp(Src) 98.2 F (36.8 C) (Oral)  Resp 18  SpO2 98% Physical Exam  Constitutional: He appears well-developed and well-nourished. No distress.  HENT:  Head: Normocephalic and atraumatic.  Eyes: Conjunctivae are normal.  Neck: Neck supple.  Cardiovascular: Normal rate, regular rhythm and normal heart sounds.   Pulmonary/Chest: Effort normal. No respiratory distress. He has no wheezes. He has no rales.  Abdominal: Soft. Bowel sounds are normal. He exhibits no distension. There is no tenderness. There is no rebound.  No CVA tenderness bilaterally  Musculoskeletal: He exhibits no edema.  ttp around right periscapular area. Pain with right shoulder ROM  Neurological: He is alert.  Skin: Skin is warm and dry.  Nursing note and vitals reviewed.   ED Course  Procedures (including critical care time) Labs Review Labs Reviewed  BASIC METABOLIC PANEL - Abnormal; Notable for the following:    Glucose, Bld 103 (*)    All other components within normal limits  URINALYSIS, ROUTINE W REFLEX MICROSCOPIC (NOT AT [facility]) - Abnormal; Notable for the following:    Hgb urine dipstick SMALL (*)    Leukocytes, UA SMALL (*)    All other components within normal limits  URINE MICROSCOPIC-ADD ON - Abnormal; Notable for the following:     Crystals CA OXALATE CRYSTALS (*)    All other components within normal limits  CBC    Imaging Review Dg Chest 2 View  10/30/2015  CLINICAL DATA:  24 year old with chronic 2-3 month history of right-sided chest pain and right-sided back pain. Current smoker with asthma. Current history of urinary tract calculi. EXAM: CHEST  2 VIEW COMPARISON:  06/06/2008. FINDINGS: Cardiomediastinal silhouette unremarkable, unchanged. Mild hyperinflation, more so than on the prior exam. Lungs clear. Bronchovascular markings normal. Pulmonary vascularity normal. No visible pleural effusions. No pneumothorax. Visualized bony thorax intact. IMPRESSION: No acute cardiopulmonary disease. Mild hyperinflation which may be due to the given history of asthma or may be related to an excellent inspiratory effort. Electronically Signed   By: Hulan Saas M.D.   On: 10/30/2015 18:13   I have personally reviewed and evaluated these images and lab results as part of my medical decision-making.   EKG Interpretation None      MDM   Final diagnoses:  Periscapular pain    patient with right periscapular pain. This pain is higher than where his anatomical position of the kidney is, I do not think that  his pain is related to a kidney stone. He has no CVA tenderness and no pain in the abdomen with palpation. Will check urinalysis, chest x-ray, labs. Percocet and Toradol given for pain.  Patient's labs all unremarkable, urinalysis did not crossover in our system but is normal other than small hemoglobin, small leukocyte esterase, rare epithelial cells, 11-20 white blood cells, 11-20 RBCs, rare bacteria. Calcium oxalate crystals present as well. Prior records showed that patient did have a CT scan on 10/18/15 which showed staghorn for 14 mm stone in the kidney with no evidence of obstruction or hydronephrosis. I do not think that this stone is what is causing his pain either. I will have him try to take muscle relaxants,  NSAIDs, follow-up with a primary care doctor for recheck. Return precautions discussed  Filed Vitals:   10/30/15 1538 10/30/15 1810  BP: 109/56 109/56  Pulse: 100 64  Temp: 98.2 F (36.8 C) 98.3 F (36.8 C)  TempSrc: Oral Oral  Resp: 18 16  SpO2: 98% 100%     Jaynie Crumbleatyana Zackaria Burkey, PA-C 10/31/15 0031  Marily MemosJason Mesner, MD 11/04/15 1425

## 2015-10-30 NOTE — ED Notes (Signed)
Patient D/C to home.  Father is picking patient up.  Reviewed F/U care and medications.

## 2016-07-04 ENCOUNTER — Emergency Department (HOSPITAL_BASED_OUTPATIENT_CLINIC_OR_DEPARTMENT_OTHER)
Admission: EM | Admit: 2016-07-04 | Discharge: 2016-07-04 | Disposition: A | Discharge status: Home / Self Care | Payer: No Typology Code available for payment source | Attending: Emergency Medicine | Admitting: Emergency Medicine

## 2016-07-04 ENCOUNTER — Encounter (HOSPITAL_BASED_OUTPATIENT_CLINIC_OR_DEPARTMENT_OTHER): Payer: Self-pay | Admitting: Emergency Medicine

## 2016-07-04 DIAGNOSIS — K0889 Other specified disorders of teeth and supporting structures: Secondary | ICD-10-CM | POA: Insufficient documentation

## 2016-07-04 DIAGNOSIS — J45909 Unspecified asthma, uncomplicated: Secondary | ICD-10-CM | POA: Insufficient documentation

## 2016-07-04 DIAGNOSIS — F17210 Nicotine dependence, cigarettes, uncomplicated: Secondary | ICD-10-CM | POA: Insufficient documentation

## 2016-07-04 MED ORDER — PENICILLIN V POTASSIUM 500 MG PO TABS: 500.0000 mg | ORAL_TABLET | Freq: Four times a day (QID) | ORAL | Status: AC

## 2016-07-04 MED ORDER — NAPROXEN 500 MG PO TABS: 500.0000 mg | ORAL_TABLET | Freq: Two times a day (BID) | ORAL | Status: DC

## 2016-07-04 NOTE — ED Notes (Signed)
Pt states he has had a broken tooth for a few weeks. Feels throbbing sensation in temple area and a lump and pain in the jaw area.

## 2016-07-04 NOTE — ED Notes (Signed)
Pt with broken tooth x 2 weeks causing L sided facial pain. Ambulatory in NAD.

## 2016-07-04 NOTE — ED Notes (Signed)
Pt given d/c instructions as per chart. Rx x 2. Verbalizes understanding. No questions. 

## 2016-07-04 NOTE — ED Provider Notes (Signed)
CSN: [id number]     Arrival date & time 07/04/16  1723 History  By signing my name below, I, Evon Slack, attest that this documentation has been prepared under the direction and in the presence of [employer], PA-C. Electronically Signed: Evon Slack, ED Scribe. 07/04/2016. 6:26 PM.     Chief Complaint  Patient presents with  . Dental Pain    The history is provided by the patient. No language interpreter was used.   HPI Comments: Nathan Tanner is a 25 y.o. male who presents to the Emergency Department complaining of gradually worsening lower left sided dental pain onset 2 weeks prior. Pt reports broken tooth. He states that he has associated facial swelling and radiating HA. He states that the pain is worse when swallowing. Pt states that previously ibuprofen was providing relief. Denies fever or trouble swallowing.  He does not have a Education officer, community.     Past Medical History  Diagnosis Date  . Kidney stones   . Asthma   . 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 Ureteral stent placement  2012   History reviewed. No pertinent family history. Social History  Substance Use Topics  . Smoking status: Current Every Day Smoker -- 0.20 packs/day for 6 years    Types: Cigarettes  . Smokeless tobacco: Never Used  . Alcohol Use: No    Review of Systems A complete 10 system review of systems was obtained and all systems are negative except as noted in the HPI and PMH.     Allergies  Bee venom  Home Medications   Prior to Admission medications   Medication Sig Start Date End Date Taking? Authorizing Provider  albuterol (PROVENTIL HFA;VENTOLIN HFA) 108 (90 BASE) MCG/ACT inhaler Inhale 2 puffs into the lungs every 6 (six) hours as needed for wheezing or shortness of breath. For shortness of breath and wheezing    Historical Provider, MD  EPINEPHrine (EPI-PEN) 0.3 mg/0.3 mL DEVI Inject  0.3 mg into the muscle daily as needed (Anaphylaxis). 11/01/12   Teressa Lower, NP  Fluticasone-Salmeterol (ADVAIR) 250-50 MCG/DOSE AEPB Inhale 1 puff into the lungs every 12 (twelve) hours as needed (Asthma).     Historical Provider, MD  HYDROcodone-acetaminophen (NORCO/VICODIN) 5-325 MG tablet Take 1-2 tablets by mouth every 6 (six) hours as needed for moderate pain. Patient not taking: Reported on 10/30/2015 10/18/15   Vanetta Mulders, MD  HYDROmorphone (DILAUDID) 4 MG tablet Take 1 tablet (4 mg total) by mouth every 6 (six) hours as needed for moderate pain or severe pain. Patient not taking: Reported on 10/30/2015 08/26/15   Malen Gauze, MD  methocarbamol (ROBAXIN) 500 MG tablet Take 1 tablet (500 mg total) by mouth 2 (two) times daily. 10/30/15   Tatyana Kirichenko, PA-C  naproxen (NAPROSYN) 500 MG tablet Take 1 tablet (500 mg total) by mouth 2 (two) times daily. 10/30/15   Tatyana Kirichenko, PA-C  ondansetron (ZOFRAN ODT) 4 MG disintegrating tablet Take 1 tablet (4 mg total) by mouth every 8 (eight) hours as needed for nausea or vomiting. Patient not taking: Reported on 10/30/2015 10/18/15   Vanetta Mulders, MD  ondansetron (ZOFRAN) 4 MG tablet Take 1 tablet (4 mg total) by mouth every 6 (six) hours. Patient not taking: Reported on 10/30/2015 08/26/15   Malen Gauze, MD  oxyCODONE-acetaminophen (PERCOCET) 5-325 MG per tablet Take 1 tablet by mouth every 6 (six) hours  as needed. Patient not taking: Reported on 08/24/2015 08/23/15   Nelva Nayobert Beaton, MD  tamsulosin (FLOMAX) 0.4 MG CAPS capsule Take 1 capsule (0.4 mg total) by mouth daily. Patient not taking: Reported on 10/30/2015 08/26/15   Malen GauzePatrick L McKenzie, MD   BP 116/69 mmHg  Pulse 71  Temp(Src) 98.8 F (37.1 C) (Oral)  Resp 18  Ht 5\' 11"  (1.803 m)  Wt 145 lb (65.772 kg)  BMI 20.23 kg/m2  SpO2 97%    Physical Exam  Constitutional: He is oriented to person, place, and time. He appears well-developed and well-nourished. No  distress.  HENT:  Head: Normocephalic and atraumatic.  Dental decay of left lower molar. TTP and swelling of surrounding gingiva. No palpable abscess. No sublingual tenderness or swelling.  No submandibular or submental lymphadenopathy  Eyes: Conjunctivae and EOM are normal.  Neck: Normal range of motion. Neck supple.  Cardiovascular: Normal rate, regular rhythm and normal heart sounds.   Pulmonary/Chest: Effort normal and breath sounds normal. No respiratory distress.  Musculoskeletal: Normal range of motion.  Neurological: He is alert and oriented to person, place, and time.  Skin: Skin is warm and dry.  Psychiatric: He has a normal mood and affect. His behavior is normal.  Nursing note and vitals reviewed.   ED Course  Procedures (including critical care time) DIAGNOSTIC STUDIES: Oxygen Saturation is 97% on RA, normal by my interpretation.    COORDINATION OF CARE: 6:26 PM-Discussed treatment plan which includes antibiotics with pt at bedside and pt agreed to plan.     Labs Review Labs Reviewed - No data to display  Imaging Review No results found.    EKG Interpretation None      MDM   Final diagnoses:  None  Patient with dental pain.  No gross abscess.  Exam unconcerning for Ludwig's angina or spread of infection.  Will treat with penicillin and NSAIDS.   Given referral to dentist.  Return precautions given.   I personally performed the services described in this documentation, which was scribed in my presence. The recorded information has been reviewed and is accurate.      Santiago GladHeather Latrail Pounders, PA-C 07/04/16 2005  Marily MemosJason Mesner, MD 07/04/16 [phone]

## 2016-10-14 ENCOUNTER — Encounter (HOSPITAL_BASED_OUTPATIENT_CLINIC_OR_DEPARTMENT_OTHER): Payer: Self-pay | Admitting: *Deleted

## 2016-10-14 ENCOUNTER — Emergency Department (HOSPITAL_BASED_OUTPATIENT_CLINIC_OR_DEPARTMENT_OTHER)
Admission: EM | Admit: 2016-10-14 | Discharge: 2016-10-14 | Disposition: A | Discharge status: Home / Self Care | Payer: No Typology Code available for payment source | Attending: Emergency Medicine | Admitting: Emergency Medicine

## 2016-10-14 DIAGNOSIS — R0789 Other chest pain: Secondary | ICD-10-CM | POA: Insufficient documentation

## 2016-10-14 DIAGNOSIS — F17210 Nicotine dependence, cigarettes, uncomplicated: Secondary | ICD-10-CM | POA: Diagnosis not present

## 2016-10-14 DIAGNOSIS — Y9389 Activity, other specified: Secondary | ICD-10-CM | POA: Diagnosis not present

## 2016-10-14 DIAGNOSIS — Y92410 Unspecified street and highway as the place of occurrence of the external cause: Secondary | ICD-10-CM | POA: Insufficient documentation

## 2016-10-14 DIAGNOSIS — Y999 Unspecified external cause status: Secondary | ICD-10-CM | POA: Insufficient documentation

## 2016-10-14 DIAGNOSIS — J45909 Unspecified asthma, uncomplicated: Secondary | ICD-10-CM | POA: Diagnosis not present

## 2016-10-14 MED ORDER — NAPROXEN 250 MG PO TABS: 250.0000 mg | ORAL_TABLET | Freq: Two times a day (BID) | ORAL | 0 refills | Status: DC
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
## 2016-12-13 ENCOUNTER — Emergency Department (HOSPITAL_BASED_OUTPATIENT_CLINIC_OR_DEPARTMENT_OTHER): Payer: Self-pay

## 2016-12-13 ENCOUNTER — Encounter (HOSPITAL_BASED_OUTPATIENT_CLINIC_OR_DEPARTMENT_OTHER): Payer: Self-pay | Admitting: *Deleted

## 2016-12-13 ENCOUNTER — Emergency Department (HOSPITAL_BASED_OUTPATIENT_CLINIC_OR_DEPARTMENT_OTHER)
Admission: EM | Admit: 2016-12-13 | Discharge: 2016-12-13 | Disposition: A | Discharge status: Home / Self Care | Payer: Self-pay | Attending: Emergency Medicine | Admitting: Emergency Medicine

## 2016-12-13 DIAGNOSIS — F17210 Nicotine dependence, cigarettes, uncomplicated: Secondary | ICD-10-CM | POA: Insufficient documentation

## 2016-12-13 DIAGNOSIS — Z79899 Other long term (current) drug therapy: Secondary | ICD-10-CM | POA: Insufficient documentation

## 2016-12-13 DIAGNOSIS — J45909 Unspecified asthma, uncomplicated: Secondary | ICD-10-CM | POA: Insufficient documentation

## 2016-12-13 DIAGNOSIS — M545 Low back pain, unspecified: Secondary | ICD-10-CM

## 2016-12-13 DIAGNOSIS — R109 Unspecified abdominal pain: Secondary | ICD-10-CM | POA: Insufficient documentation

## 2016-12-13 DIAGNOSIS — R102 Pelvic and perineal pain: Secondary | ICD-10-CM | POA: Insufficient documentation

## 2016-12-13 LAB — BASIC METABOLIC PANEL
Anion gap: 7 (ref 5–15)
BUN: 16 mg/dL (ref 6–20)
CHLORIDE: 104 mmol/L (ref 101–111)
CO2: 30 mmol/L (ref 22–32)
CREATININE: 1.12 mg/dL (ref 0.61–1.24)
Calcium: 9.7 mg/dL (ref 8.9–10.3)
GFR calc Af Amer: >60 mL/min (ref >60)
GFR calc non Af Amer: >60 mL/min (ref >60)
Glucose, Bld: 83 mg/dL (ref 65–99)
POTASSIUM: 3.7 mmol/L (ref 3.5–5.1)
SODIUM: 141 mmol/L (ref 135–145)

## 2016-12-13 LAB — CBC WITH DIFFERENTIAL/PLATELET
Basophils Absolute: 0 10*3/uL (ref 0.0–0.1)
Basophils Relative: 0 %
EOS ABS: 0.1 10*3/uL (ref 0.0–0.7)
Eosinophils Relative: 2 %
HEMATOCRIT: 38.2 % — AB (ref 39.0–52.0)
HEMOGLOBIN: 12.5 g/dL — AB (ref 13.0–17.0)
LYMPHS ABS: 3 10*3/uL (ref 0.7–4.0)
LYMPHS PCT: 51 %
MCH: 29.7 pg (ref 26.0–34.0)
MCHC: 32.7 g/dL (ref 30.0–36.0)
MCV: 90.7 fL (ref 78.0–100.0)
MONOS PCT: 9 %
Monocytes Absolute: 0.5 10*3/uL (ref 0.1–1.0)
NEUTROS PCT: 38 %
Neutro Abs: 2.2 10*3/uL (ref 1.7–7.7)
Platelets: 221 10*3/uL (ref 150–400)
RBC: 4.21 MIL/uL — ABNORMAL LOW (ref 4.22–5.81)
RDW: 12 % (ref 11.5–15.5)
WBC: 5.8 10*3/uL (ref 4.0–10.5)

## 2016-12-13 LAB — URINALYSIS, ROUTINE W REFLEX MICROSCOPIC
BILIRUBIN URINE: NEGATIVE
Glucose, UA: NEGATIVE mg/dL
Hgb urine dipstick: NEGATIVE
Ketones, ur: NEGATIVE mg/dL
NITRITE: NEGATIVE
PH: 7.5 (ref 5.0–8.0)
Protein, ur: NEGATIVE mg/dL
SPECIFIC GRAVITY, URINE: 1.02 (ref 1.005–1.030)

## 2016-12-13 LAB — URINALYSIS, MICROSCOPIC (REFLEX)

## 2016-12-13 MED ORDER — SODIUM CHLORIDE 0.9 % IV BOLUS (SEPSIS)
1000.0000 mL | Freq: Once | INTRAVENOUS | Status: AC
  Administered 2016-12-13: 1000 mL via INTRAVENOUS

## 2016-12-13 MED ORDER — KETOROLAC TROMETHAMINE 30 MG/ML IJ SOLN
30.0000 mg | Freq: Once | INTRAMUSCULAR | Status: AC
  Administered 2016-12-13: 30 mg via INTRAVENOUS
  Filled 2016-12-13: qty 1

## 2016-12-13 MED ORDER — OXYCODONE HCL 5 MG PO TABS
5.0000 mg | ORAL_TABLET | Freq: Once | ORAL | Status: AC
  Administered 2016-12-13: 5 mg via ORAL
  Filled 2016-12-13: qty 1

## 2016-12-13 MED ORDER — ACETAMINOPHEN 500 MG PO TABS
1000.0000 mg | ORAL_TABLET | Freq: Once | ORAL | Status: AC
  Administered 2016-12-13: 1000 mg via ORAL
  Filled 2016-12-13: qty 2

## 2016-12-13 NOTE — ED Provider Notes (Signed)
[facility]   CSN: [id number] Arrival date & time: 12/13/16  1508  By signing my name below, I, Vista Minkobert Ross, attest that this documentation has been prepared under the direction and in the presence of No att. providers found. Electronically signed, Vista Minkobert Ross, ED Scribe. 12/13/16. 4:00 PM.   History   Chief Complaint Chief Complaint  Patient presents with  . Back Pain    HPI HPI Comments: Karen KitchensDante Yung is a 25 y.o. male, with Hx of kidney stones, who presents to the Emergency Department complaining of intermittent, gradually worsening, right flank pain that started 3 months ago. He states "it feels like something is pressing on my spine". Pt describes his pain as a burning/stabbing sensation. He reports recent hematuria within the past week but pt has not urinated in 2 days. He has Hx of kidney stones in 07/2015. No other associated symptoms noted.  The history is provided by the patient. No language interpreter was used.    Past Medical History:  Diagnosis Date  . 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 Asthma   . Kidney stones     Patient Active Problem List   Diagnosis Date Noted  . Kidney stone 08/24/2015  . Ureteral stone 08/24/2015  . Kidney stones     Past Surgical History:  Procedure Laterality Date  . URETERAL STENT PLACEMENT  2012       Home Medications    Prior to Admission medications   Medication Sig Start Date End Date Taking? Authorizing Provider  albuterol (PROVENTIL HFA;VENTOLIN HFA) 108 (90 BASE) MCG/ACT inhaler Inhale 2 puffs into the lungs every 6 (six) hours as needed for wheezing or shortness of breath. For shortness of breath and wheezing    Historical Provider, MD  EPINEPHrine (EPI-PEN) 0.3 mg/0.3 mL DEVI Inject 0.3 mg into the muscle daily as needed (Anaphylaxis). 11/01/12   Teressa LowerVrinda Pickering, NP  Fluticasone-Salmeterol (ADVAIR) 250-50 MCG/DOSE AEPB Inhale 1 puff into the  lungs every 12 (twelve) hours as needed (Asthma).     Historical Provider, MD  naproxen (NAPROSYN) 250 MG tablet Take 1 tablet (250 mg total) by mouth 2 (two) times daily with a meal. 10/14/16   Everlene FarrierWilliam Dansie, PA-C    Family History No family history on file.  Social History Social History  Substance Use Topics  . Smoking status: Current Every Day Smoker    Packs/day: 0.20    Years: 6.00    Types: Cigarettes  . Smokeless tobacco: Never Used  . Alcohol use No    Allergies   Bee venom   Review of Systems Review of Systems  Constitutional: Negative for chills and fever.  HENT: Negative for congestion and facial swelling.   Eyes: Negative for discharge and visual disturbance.  Respiratory: Negative for shortness of breath.   Cardiovascular: Negative for chest pain and palpitations.  Gastrointestinal: Negative for abdominal pain, diarrhea and vomiting.  Genitourinary: Positive for flank pain (right lower).  Musculoskeletal: Negative for arthralgias and myalgias.  Skin: Negative for color change and rash.  Neurological: Negative for tremors, syncope and headaches.  Psychiatric/Behavioral: Negative for confusion and dysphoric mood.     Physical Exam Updated Vital Signs BP 118/68 (BP Location: Left Arm)   Pulse 68   Temp 98 F (36.7 C) (Oral)   Resp 20   Ht 5\' 11"  (1.803 m)   Wt 140 lb (63.5 kg)   SpO2 99%   BMI 19.53  kg/m   Physical Exam  Constitutional: He is oriented to person, place, and time. He appears well-developed and well-nourished.  HENT:  Head: Normocephalic and atraumatic.  Eyes: EOM are normal. Pupils are equal, round, and reactive to light.  Neck: Normal range of motion. Neck supple. No JVD present.  Cardiovascular: Normal rate and regular rhythm.  Exam reveals no gallop and no friction rub.   No murmur heard. Pulmonary/Chest: No respiratory distress. He has no wheezes.  Abdominal: He exhibits no distension. There is no rebound and no guarding.    Musculoskeletal: Normal range of motion. He exhibits tenderness.  Tenderness along right lower paraspinal musculature. No midline tenderness. Negative straight leg. Pulse and motor sensation intact distally.  Neurological: He is alert and oriented to person, place, and time.  Skin: No rash noted. No pallor.  Psychiatric: He has a normal mood and affect. His behavior is normal.  Nursing note and vitals reviewed.    ED Treatments / Results  DIAGNOSTIC STUDIES: Oxygen Saturation is 94% on RA, normal by my interpretation.  COORDINATION OF CARE: 3:48 PM-Discussed treatment plan with pt at bedside and pt agreed to plan.   Labs (all labs ordered are listed, but only abnormal results are displayed) Labs Reviewed  URINALYSIS, ROUTINE W REFLEX MICROSCOPIC - Abnormal; Notable for the following:       Result Value   APPearance CLOUDY (*)    Leukocytes, UA SMALL (*)    All other components within normal limits  CBC WITH DIFFERENTIAL/PLATELET - Abnormal; Notable for the following:    RBC 4.21 (*)    Hemoglobin 12.5 (*)    HCT 38.2 (*)    All other components within normal limits  URINALYSIS, MICROSCOPIC (REFLEX) - Abnormal; Notable for the following:    Bacteria, UA FEW (*)    Squamous Epithelial / LPF 0-5 (*)    All other components within normal limits  BASIC METABOLIC PANEL    EKG  EKG Interpretation None       Radiology US Renal  Result Date: 12/13/2016 CLINICAL DATA:  25 year old male with right flank pain. Right pelvic and lumbar back region pain for 40 a weeks. Difficulty urinating for 2 days. Personal history of nephrolithiasis. Initial encounter. EXAM: RENAL / URINARY TRACT ULTRASOUND COMPLETE COMPARISON:  Noncontrast CT Abdomen and Pelvis 10/18/2015 FINDINGS: Right Kidney: Length: 11.3 cm. No hydronephrosis. No right renal mass. Evidence of right nephrolithiasis, including a shadowing upper pole region calculus estimated at 5-6 mm. Left Kidney: Length: 11.9 cm. No  hydronephrosis. Evidence of left lower pole calculus (image 26). No left renal mass. Bladder: Echogenic debris within the urinary bladder (image 38). The small pelvic calcification depicted on image 43 most likely corresponds to that within the prostate on the comparison CT (seen on series 2, image 75 in 2016). IMPRESSION: 1. Echogenic debris within the urinary bladder suspicious for urinary tract infection. 2. Evidence of bilateral nephrolithiasis.  No hydronephrosis. Electronically Signed   By: Odessa Fleming M.D.   On: 12/13/2016 17:21    Procedures Procedures (including critical care time)  Medications Ordered in ED Medications  ketorolac (TORADOL) 30 MG/ML injection 30 mg (30 mg Intravenous Given 12/13/16 1606)  sodium chloride 0.9 % bolus 1,000 mL (0 mLs Intravenous Stopped 12/13/16 1750)  acetaminophen (TYLENOL) tablet 1,000 mg (1,000 mg Oral Given 12/13/16 1600)  oxyCODONE (Oxy IR/ROXICODONE) immediate release tablet 5 mg (5 mg Oral Given 12/13/16 1600)     Initial Impression / Assessment and Plan / ED Course  I have reviewed the triage vital signs and the nursing notes.  Pertinent labs & imaging results that were available during my care of the patient were reviewed by me and considered in my medical decision making (see chart for details).  Clinical Course     25 yo M With a chief complaint of right-sided low back pain. This been going on for about a week. Worse with movement palpation twisting. He feels like his prior kidney stone. Pain is reproduced on palpation of the low back. Patient has had 8 CT scans in the past 8 years. Ultrasound with no hydronephrosis. I suspect that this is muscular skeletal back pain. No hematuria on UA.  There was some concern by radiology for a urinary tract infection based on the ultrasound of the bladder. As the patient had no findings on UA I will not treat at this time.  11:39 PM:  I have discussed the diagnosis/risks/treatment options with the patient  and family and believe the pt to be eligible for discharge home to follow-up with PCP. We also discussed returning to the ED immediately if new or worsening sx occur. We discussed the sx which are most concerning (e.g., sudden worsening pain, fever, inability to tolerate by mouth) that necessitate immediate return. Medications administered to the patient during their visit and any new prescriptions provided to the patient are listed below.  Medications given during this visit Medications  ketorolac (TORADOL) 30 MG/ML injection 30 mg (30 mg Intravenous Given 12/13/16 1606)  sodium chloride 0.9 % bolus 1,000 mL (0 mLs Intravenous Stopped 12/13/16 1750)  acetaminophen (TYLENOL) tablet 1,000 mg (1,000 mg Oral Given 12/13/16 1600)  oxyCODONE (Oxy IR/ROXICODONE) immediate release tablet 5 mg (5 mg Oral Given 12/13/16 1600)     The patient appears reasonably screen and/or stabilized for discharge and I doubt any other medical condition or other [facility] requiring further screening, evaluation, or treatment in the ED at this time prior to discharge.    Final Clinical Impressions(s) / ED Diagnoses   Final diagnoses:  Acute right-sided low back pain without sciatica    New Prescriptions Discharge Medication List as of 12/13/2016  5:40 PM    I personally performed the services described in this documentation, which was scribed in my presence. The recorded information has been reviewed and is accurate.     Melene Planan Tracer Gutridge, DO 12/13/16 2339

## 2016-12-13 NOTE — ED Triage Notes (Signed)
Right flank pain for over 3 months. He is now having hematuria.

## 2016-12-13 NOTE — Discharge Instructions (Signed)
Take 4 over the counter ibuprofen tablets 3 times a day or 2 over-the-counter naproxen tablets twice a day for pain. Also take tylenol 1000mg(2 extra strength) four times a day.    

## 2018-01-16 ENCOUNTER — Encounter (HOSPITAL_COMMUNITY): Payer: Self-pay | Admitting: Emergency Medicine

## 2018-01-16 DIAGNOSIS — R1031 Right lower quadrant pain: Secondary | ICD-10-CM | POA: Insufficient documentation

## 2018-01-16 DIAGNOSIS — Z5321 Procedure and treatment not carried out due to patient leaving prior to being seen by health care provider: Secondary | ICD-10-CM | POA: Insufficient documentation

## 2018-01-16 NOTE — ED Triage Notes (Signed)
Patient c/o right side flank pain x "a few months". Hx kidney stones with stent placement. Also reports nausea.

## 2018-01-17 ENCOUNTER — Emergency Department (HOSPITAL_COMMUNITY)
Admission: EM | Admit: 2018-01-17 | Discharge: 2018-01-17 | Disposition: A | Discharge status: Home / Self Care | Payer: Federal, State, Local not specified - PPO | Attending: Emergency Medicine | Admitting: Emergency Medicine

## 2018-01-17 NOTE — ED Notes (Signed)
No response when called for room 

## 2018-01-19 NOTE — ED Notes (Signed)
Called pt. For a follow up call no answer, message left for a return call.  01/19/2018

## 2018-07-05 ENCOUNTER — Other Ambulatory Visit: Payer: Self-pay

## 2018-07-05 ENCOUNTER — Emergency Department (HOSPITAL_BASED_OUTPATIENT_CLINIC_OR_DEPARTMENT_OTHER)
Admission: EM | Admit: 2018-07-05 | Discharge: 2018-07-06 | Disposition: A | Discharge status: Home / Self Care | Payer: Federal, State, Local not specified - PPO | Attending: Emergency Medicine | Admitting: Emergency Medicine

## 2018-07-05 ENCOUNTER — Emergency Department (HOSPITAL_BASED_OUTPATIENT_CLINIC_OR_DEPARTMENT_OTHER): Payer: Federal, State, Local not specified - PPO

## 2018-07-05 ENCOUNTER — Encounter (HOSPITAL_BASED_OUTPATIENT_CLINIC_OR_DEPARTMENT_OTHER): Payer: Self-pay

## 2018-07-05 DIAGNOSIS — Z79899 Other long term (current) drug therapy: Secondary | ICD-10-CM | POA: Insufficient documentation

## 2018-07-05 DIAGNOSIS — F17210 Nicotine dependence, cigarettes, uncomplicated: Secondary | ICD-10-CM | POA: Insufficient documentation

## 2018-07-05 DIAGNOSIS — F12188 Cannabis abuse with other cannabis-induced disorder: Secondary | ICD-10-CM

## 2018-07-05 DIAGNOSIS — N3090 Cystitis, unspecified without hematuria: Secondary | ICD-10-CM

## 2018-07-05 DIAGNOSIS — R51 Headache: Secondary | ICD-10-CM | POA: Insufficient documentation

## 2018-07-05 DIAGNOSIS — J45909 Unspecified asthma, uncomplicated: Secondary | ICD-10-CM | POA: Insufficient documentation

## 2018-07-05 DIAGNOSIS — F12288 Cannabis dependence with other cannabis-induced disorder: Secondary | ICD-10-CM | POA: Insufficient documentation

## 2018-07-05 LAB — COMPREHENSIVE METABOLIC PANEL
ALBUMIN: 4.5 g/dL (ref 3.5–5.0)
ALT: 20 U/L (ref 0–44)
AST: 27 U/L (ref 15–41)
Alkaline Phosphatase: 72 U/L (ref 38–126)
Anion gap: 8 (ref 5–15)
BILIRUBIN TOTAL: 0.5 mg/dL (ref 0.3–1.2)
BUN: 15 mg/dL (ref 6–20)
CO2: 26 mmol/L (ref 22–32)
Calcium: 9.2 mg/dL (ref 8.9–10.3)
Chloride: 105 mmol/L (ref 98–111)
Creatinine, Ser: 1.11 mg/dL (ref 0.61–1.24)
GFR calc Af Amer: >60 mL/min (ref >60)
GFR calc non Af Amer: >60 mL/min (ref >60)
GLUCOSE: 96 mg/dL (ref 70–99)
Potassium: 3.2 mmol/L — ABNORMAL LOW (ref 3.5–5.1)
Sodium: 139 mmol/L (ref 135–145)
TOTAL PROTEIN: 7.7 g/dL (ref 6.5–8.1)

## 2018-07-05 LAB — CBC
HCT: 38.5 % — ABNORMAL LOW (ref 39.0–52.0)
HEMOGLOBIN: 13 g/dL (ref 13.0–17.0)
MCH: 30.5 pg (ref 26.0–34.0)
MCHC: 33.8 g/dL (ref 30.0–36.0)
MCV: 90.4 fL (ref 78.0–100.0)
PLATELETS: 233 10*3/uL (ref 150–400)
RBC: 4.26 MIL/uL (ref 4.22–5.81)
RDW: 12.1 % (ref 11.5–15.5)
WBC: 7.3 10*3/uL (ref 4.0–10.5)

## 2018-07-05 LAB — LIPASE, BLOOD: Lipase: 33 U/L (ref 11–51)

## 2018-07-05 MED ORDER — KETOROLAC TROMETHAMINE 30 MG/ML IJ SOLN
15.0000 mg | Freq: Once | INTRAMUSCULAR | Status: AC
  Administered 2018-07-06: 15 mg via INTRAVENOUS
  Filled 2018-07-05: qty 1

## 2018-07-05 MED ORDER — DIPHENHYDRAMINE HCL 50 MG/ML IJ SOLN
12.5000 mg | Freq: Once | INTRAMUSCULAR | Status: AC
  Administered 2018-07-05: 12.5 mg via INTRAVENOUS
  Filled 2018-07-05: qty 1

## 2018-07-05 MED ORDER — ONDANSETRON HCL 4 MG/2ML IJ SOLN
4.0000 mg | Freq: Once | INTRAMUSCULAR | Status: AC | PRN
  Administered 2018-07-05: 4 mg via INTRAVENOUS
  Filled 2018-07-05: qty 2

## 2018-07-05 MED ORDER — METOCLOPRAMIDE HCL 5 MG/ML IJ SOLN
10.0000 mg | Freq: Once | INTRAMUSCULAR | Status: AC
  Administered 2018-07-05: 10 mg via INTRAVENOUS
  Filled 2018-07-05: qty 2

## 2018-07-05 MED ORDER — SODIUM CHLORIDE 0.9 % IV BOLUS
1000.0000 mL | Freq: Once | INTRAVENOUS | Status: AC
  Administered 2018-07-05: 1000 mL via INTRAVENOUS

## 2018-07-05 NOTE — ED Notes (Signed)
Patient transported to CT 

## 2018-07-05 NOTE — ED Triage Notes (Addendum)
Pt c/o n/v, HA-"few seconds" syncopal episode per mother approx 20 min PTA-NAD-to triage in w/c-pt kept self wrapped in blanket with eyes closed-answered very few ?s with mother answering the majority of triage ?s

## 2018-07-06 ENCOUNTER — Emergency Department (HOSPITAL_BASED_OUTPATIENT_CLINIC_OR_DEPARTMENT_OTHER): Payer: Federal, State, Local not specified - PPO

## 2018-07-06 ENCOUNTER — Encounter (HOSPITAL_BASED_OUTPATIENT_CLINIC_OR_DEPARTMENT_OTHER): Payer: Self-pay | Admitting: Emergency Medicine

## 2018-07-06 LAB — URINALYSIS, MICROSCOPIC (REFLEX)

## 2018-07-06 LAB — RAPID URINE DRUG SCREEN, HOSP PERFORMED
Amphetamines: NOT DETECTED
BENZODIAZEPINES: NOT DETECTED
COCAINE: NOT DETECTED
Opiates: NOT DETECTED
Tetrahydrocannabinol: POSITIVE — AB

## 2018-07-06 LAB — URINALYSIS, ROUTINE W REFLEX MICROSCOPIC
Bilirubin Urine: NEGATIVE
Glucose, UA: NEGATIVE mg/dL
KETONES UR: 15 mg/dL — AB
LEUKOCYTES UA: NEGATIVE
Nitrite: NEGATIVE
PH: 6 (ref 5.0–8.0)
Protein, ur: NEGATIVE mg/dL
Specific Gravity, Urine: 1.02 (ref 1.005–1.030)

## 2018-07-06 MED ORDER — HALOPERIDOL LACTATE 5 MG/ML IJ SOLN
2.5000 mg | Freq: Once | INTRAMUSCULAR | Status: AC
  Administered 2018-07-06: 2.5 mg via INTRAVENOUS
  Filled 2018-07-06: qty 1

## 2018-07-06 MED ORDER — CEPHALEXIN 500 MG PO CAPS: 500.0000 mg | ORAL_CAPSULE | Freq: Four times a day (QID) | ORAL | 0 refills | Status: DC

## 2018-07-06 MED ORDER — HALOPERIDOL LACTATE 5 MG/ML IJ SOLN
5.0000 mg | Freq: Once | INTRAMUSCULAR | Status: AC
  Administered 2018-07-06: 5 mg via INTRAVENOUS
  Filled 2018-07-06: qty 1

## 2018-07-06 MED ORDER — ONDANSETRON 8 MG PO TBDP: ORAL_TABLET | ORAL | 0 refills | Status: DC

## 2018-07-06 MED ORDER — CAPSAICIN 0.075 % EX CREA
TOPICAL_CREAM | Freq: Two times a day (BID) | CUTANEOUS | Status: DC
Start: 2018-07-06 — End: 2018-07-06
  Administered 2018-07-06: 04:00:00 via TOPICAL
  Filled 2018-07-06: qty 60

## 2018-07-06 MED ORDER — SODIUM CHLORIDE 0.9 % IV SOLN
1.0000 g | Freq: Once | INTRAVENOUS | Status: AC
  Administered 2018-07-06: 1 g via INTRAVENOUS
  Filled 2018-07-06: qty 10

## 2018-07-06 NOTE — ED Provider Notes (Signed)
[facility] EMERGENCY DEPARTMENT Provider Note   CSN: [id number] Arrival date & time: 07/05/18  2124     History   Chief Complaint Chief Complaint  Patient presents with  . Emesis    HPI Nathan Tanner is a 27 y.o. male.  The history is provided by the patient.  Emesis   This is a new problem. The current episode started 12 to 24 hours ago. The problem occurs more than 10 times per day. The problem has not changed since onset.The emesis has an appearance of stomach contents. There has been no fever. Associated symptoms include arthralgias. Pertinent negatives include no cough, no fever and no myalgias. Risk factors: marijuana use 3-4 times a day low back pain.    Past Medical History:  Diagnosis Date  . 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 Asthma   . Kidney stones     Patient Active Problem List   Diagnosis Date Noted  . Kidney stone 08/24/2015  . Ureteral stone 08/24/2015  . Kidney stones     Past Surgical History:  Procedure Laterality Date  . KIDNEY STONE SURGERY    . URETERAL STENT PLACEMENT  2012        Home Medications    Prior to Admission medications   Medication Sig Start Date End Date Taking? Authorizing Provider  albuterol (PROVENTIL HFA;VENTOLIN HFA) 108 (90 BASE) MCG/ACT inhaler Inhale 2 puffs into the lungs every 6 (six) hours as needed for wheezing or shortness of breath. For shortness of breath and wheezing    [provider]  EPINEPHrine (EPI-PEN) 0.3 mg/0.3 mL DEVI Inject 0.3 mg into the muscle daily as needed (Anaphylaxis). 11/01/12   Teressa Lower, NP  Fluticasone-Salmeterol (ADVAIR) 250-50 MCG/DOSE AEPB Inhale 1 puff into the lungs every 12 (twelve) hours as needed (Asthma).     [provider]  naproxen (NAPROSYN) 250 MG tablet Take 1 tablet (250 mg total) by mouth 2 (two) times daily with a meal. 10/14/16   Everlene Farrier, PA-C    Family History No family  history on file.  Social History Social History   Tobacco Use  . Smoking status: Current Every Day Smoker    Packs/day: 0.20    Years: 6.00    Pack years: 1.20    Types: Cigarettes  . Smokeless tobacco: Never Used  Substance Use Topics  . Alcohol use: No  . Drug use: Yes    Types: Marijuana     Allergies   Bee venom   Review of Systems Review of Systems  Constitutional: Negative for diaphoresis and fever.  Eyes: Negative for photophobia and visual disturbance.  Respiratory: Negative for cough and shortness of breath.   Cardiovascular: Negative for chest pain.  Gastrointestinal: Positive for nausea and vomiting.  Genitourinary: Negative for dysuria and flank pain.  Musculoskeletal: Positive for arthralgias. Negative for myalgias, neck pain and neck stiffness.  All other systems reviewed and are negative.    Physical Exam Updated Vital Signs BP 103/64   Pulse (!) 55   Temp 98 F (36.7 C) (Oral)   Resp (!) 22   SpO2 98%   Physical Exam  Constitutional: He is oriented to person, place, and time. He appears well-developed and well-nourished. No distress.  HENT:  Head: Normocephalic and atraumatic.  Right Ear: External ear normal.  Left Ear: External ear normal.  Mouth/Throat: Oropharynx is clear and moist. No oropharyngeal exudate.  Eyes: Pupils are equal,  round, and reactive to light. Conjunctivae are normal.  Neck: Normal range of motion. Neck supple. No spinous process tenderness and no muscular tenderness present. No neck rigidity. Normal range of motion present. No Brudzinski's sign and no Kernig's sign noted.  Cardiovascular: Normal rate, regular rhythm, normal heart sounds and intact distal pulses.  Pulmonary/Chest: Effort normal and breath sounds normal. No stridor. He has no wheezes. He has no rales.  Abdominal: Soft. Bowel sounds are normal. He exhibits no mass. There is no tenderness. There is no rebound and no guarding.  Musculoskeletal: Normal range of  motion.  Lymphadenopathy:    He has no cervical adenopathy.  Neurological: He is alert and oriented to person, place, and time. He displays normal reflexes. No cranial nerve deficit.  Skin: Skin is warm and dry. Capillary refill takes less than 2 seconds.     ED Treatments / Results  Labs (all labs ordered are listed, but only abnormal results are displayed) Results for orders placed or performed during the hospital encounter of 07/05/18  Lipase, blood  Result Value Ref Range   Lipase 33 11 - 51 U/L  Comprehensive metabolic panel  Result Value Ref Range   Sodium 139 135 - 145 mmol/L   Potassium 3.2 (L) 3.5 - 5.1 mmol/L   Chloride 105 98 - 111 mmol/L   CO2 26 22 - 32 mmol/L   Glucose, Bld 96 70 - 99 mg/dL   BUN 15 6 - 20 mg/dL   Creatinine, Ser 2.131.11 0.61 - 1.24 mg/dL   Calcium 9.2 8.9 - 08.610.3 mg/dL   Total Protein 7.7 6.5 - 8.1 g/dL   Albumin 4.5 3.5 - 5.0 g/dL   AST 27 15 - 41 U/L   ALT 20 0 - 44 U/L   Alkaline Phosphatase 72 38 - 126 U/L   Total Bilirubin 0.5 0.3 - 1.2 mg/dL   GFR calc non Af Amer >60 >60 mL/min   GFR calc Af Amer >60 >60 mL/min   Anion gap 8 5 - 15  CBC  Result Value Ref Range   WBC 7.3 4.0 - 10.5 K/uL   RBC 4.26 4.22 - 5.81 MIL/uL   Hemoglobin 13.0 13.0 - 17.0 g/dL   HCT 57.838.5 (L) 46.939.0 - 62.952.0 %   MCV 90.4 78.0 - 100.0 fL   MCH 30.5 26.0 - 34.0 pg   MCHC 33.8 30.0 - 36.0 g/dL   RDW 52.812.1 41.311.5 - 24.415.5 %   Platelets 233 150 - 400 K/uL  Urinalysis, Routine w reflex microscopic  Result Value Ref Range   Color, Urine YELLOW YELLOW   APPearance CLEAR CLEAR   Specific Gravity, Urine 1.020 1.005 - 1.030   pH 6.0 5.0 - 8.0   Glucose, UA NEGATIVE NEGATIVE mg/dL   Hgb urine dipstick LARGE (A) NEGATIVE   Bilirubin Urine NEGATIVE NEGATIVE   Ketones, ur 15 (A) NEGATIVE mg/dL   Protein, ur NEGATIVE NEGATIVE mg/dL   Nitrite NEGATIVE NEGATIVE   Leukocytes, UA NEGATIVE NEGATIVE  Rapid urine drug screen (hospital performed)  Result Value Ref Range   Opiates  NONE DETECTED NONE DETECTED   Cocaine NONE DETECTED NONE DETECTED   Benzodiazepines NONE DETECTED NONE DETECTED   Amphetamines NONE DETECTED NONE DETECTED   Tetrahydrocannabinol POSITIVE (A) NONE DETECTED   Barbiturates (A) NONE DETECTED    Result not available. Reagent lot number recalled by manufacturer.  Urinalysis, Microscopic (reflex)  Result Value Ref Range   RBC / HPF 21-50 0 - 5 RBC/hpf  WBC, UA 6-10 0 - 5 WBC/hpf   Bacteria, UA MANY (A) NONE SEEN   Squamous Epithelial / LPF 0-5 0 - 5   Mucus PRESENT    Ca Oxalate Crys, UA PRESENT    Ct Head Wo Contrast  Result Date: 07/05/2018 CLINICAL DATA:  Severe bitemporal headache and syncope tonight. EXAM: CT HEAD WITHOUT CONTRAST TECHNIQUE: Contiguous axial images were obtained from the base of the skull through the vertex without intravenous contrast. COMPARISON:  None. FINDINGS: BRAIN: No intraparenchymal hemorrhage, mass effect nor midline shift. The ventricles and sulci are normal. No acute large vascular territory infarcts. No abnormal extra-axial fluid collections. Basal cisterns are patent. VASCULAR: Unremarkable. SKULL/SOFT TISSUES: No skull fracture. No significant soft tissue swelling. ORBITS/SINUSES: Small LEFT maxillary mucosal retention cyst. Imaged paranasal sinuses are otherwise well aerated. Well-aerated mastoid air cells.The mastoid aircells and included paranasal sinuses are well-aerated. OTHER: None. IMPRESSION: Normal noncontrast CT HEAD. Electronically Signed   By: Awilda Metro M.D.   On: 07/05/2018 23:41   Ct Renal Stone Study  Result Date: 07/06/2018 CLINICAL DATA:  Hematuria, urinary tract infection. Nausea and vomiting. Headache. Syncope. History of kidney stones. EXAM: CT ABDOMEN AND PELVIS WITHOUT CONTRAST TECHNIQUE: Multidetector CT imaging of the abdomen and pelvis was performed following the standard protocol without IV contrast. COMPARISON:  Renal ultrasound December 13, 2016 and CT abdomen and pelvis October 18, 2015. FINDINGS: LOWER CHEST: Lung bases are clear. The visualized heart size is normal. No pericardial effusion. HEPATOBILIARY: Normal. PANCREAS: Normal. SPLEEN: Normal. ADRENALS/URINARY TRACT: Kidneys are orthotopic, demonstrating normal size and morphology. Increased faint calcifications of the renal pyramids. 9 mm RIGHT upper pole nephrolithiasis. 3 mm RIGHT lower pole nephrolithiasis. No hydronephrosis; limited assessment for renal masses by nonenhanced CT. The unopacified ureters are normal in course and caliber. Urinary bladder is partially distended and unremarkable. Normal adrenal glands. STOMACH/BOWEL: The stomach, small and large bowel are normal in course and caliber without inflammatory changes, sensitivity decreased by lack of enteric contrast. Normal appendix. VASCULAR/LYMPHATIC: Aortoiliac vessels are normal in course and caliber. No lymphadenopathy by CT size criteria. REPRODUCTIVE: Normal. OTHER: No intraperitoneal free fluid or free air. MUSCULOSKELETAL: Non-acute. IMPRESSION: 1. Nonobstructing RIGHT nephrolithiasis measuring to 9 mm. Progressed medullary nephrocalcinosis. Electronically Signed   By: Awilda Metro M.D.   On: 07/06/2018 03:41    EKG Interpretation  Date/Time:  Wednesday July 05 2018 22:16:01 EDT Ventricular Rate:  77 PR Interval:    QRS Duration: 83 QT Interval:  407 QTC Calculation: 461 R Axis:   80 Text Interpretation:  Sinus rhythm no acute ST/T changes Early repolarization Artifact No old tracing to compare Confirmed by Pricilla Loveless [phone]) on 07/05/2018 10:18:19 PM   Radiology Ct Head Wo Contrast  Result Date: 07/05/2018 CLINICAL DATA:  Severe bitemporal headache and syncope tonight. EXAM: CT HEAD WITHOUT CONTRAST TECHNIQUE: Contiguous axial images were obtained from the base of the skull through the vertex without intravenous contrast. COMPARISON:  None. FINDINGS: BRAIN: No intraparenchymal hemorrhage, mass effect nor midline shift. The ventricles  and sulci are normal. No acute large vascular territory infarcts. No abnormal extra-axial fluid collections. Basal cisterns are patent. VASCULAR: Unremarkable. SKULL/SOFT TISSUES: No skull fracture. No significant soft tissue swelling. ORBITS/SINUSES: Small LEFT maxillary mucosal retention cyst. Imaged paranasal sinuses are otherwise well aerated. Well-aerated mastoid air cells.The mastoid aircells and included paranasal sinuses are well-aerated. OTHER: None. IMPRESSION: Normal noncontrast CT HEAD. Electronically Signed   By: Awilda Metro M.D.   On: 07/05/2018 23:41  Ct Renal Stone Study  Result Date: 07/06/2018 CLINICAL DATA:  Hematuria, urinary tract infection. Nausea and vomiting. Headache. Syncope. History of kidney stones. EXAM: CT ABDOMEN AND PELVIS WITHOUT CONTRAST TECHNIQUE: Multidetector CT imaging of the abdomen and pelvis was performed following the standard protocol without IV contrast. COMPARISON:  Renal ultrasound December 13, 2016 and CT abdomen and pelvis October 18, 2015. FINDINGS: LOWER CHEST: Lung bases are clear. The visualized heart size is normal. No pericardial effusion. HEPATOBILIARY: Normal. PANCREAS: Normal. SPLEEN: Normal. ADRENALS/URINARY TRACT: Kidneys are orthotopic, demonstrating normal size and morphology. Increased faint calcifications of the renal pyramids. 9 mm RIGHT upper pole nephrolithiasis. 3 mm RIGHT lower pole nephrolithiasis. No hydronephrosis; limited assessment for renal masses by nonenhanced CT. The unopacified ureters are normal in course and caliber. Urinary bladder is partially distended and unremarkable. Normal adrenal glands. STOMACH/BOWEL: The stomach, small and large bowel are normal in course and caliber without inflammatory changes, sensitivity decreased by lack of enteric contrast. Normal appendix. VASCULAR/LYMPHATIC: Aortoiliac vessels are normal in course and caliber. No lymphadenopathy by CT size criteria. REPRODUCTIVE: Normal. OTHER: No  intraperitoneal free fluid or free air. MUSCULOSKELETAL: Non-acute. IMPRESSION: 1. Nonobstructing RIGHT nephrolithiasis measuring to 9 mm. Progressed medullary nephrocalcinosis. Electronically Signed   By: Awilda Metro M.D.   On: 07/06/2018 03:41    Procedures Procedures (including critical care time)  Medications Ordered in ED Medications  capsicum (ZOSTRIX) 0.075 % cream ( Topical Given 07/06/18 0337)  ondansetron (ZOFRAN) injection 4 mg (4 mg Intravenous Given 07/05/18 2241)  sodium chloride 0.9 % bolus 1,000 mL (0 mLs Intravenous Stopped 07/06/18 0026)  metoCLOPramide (REGLAN) injection 10 mg (10 mg Intravenous Given 07/05/18 2349)  diphenhydrAMINE (BENADRYL) injection 12.5 mg (12.5 mg Intravenous Given 07/05/18 2352)  ketorolac (TORADOL) 30 MG/ML injection 15 mg (15 mg Intravenous Given 07/06/18 0002)  cefTRIAXone (ROCEPHIN) 1 g in sodium chloride 0.9 % 100 mL IVPB (0 g Intravenous Stopped 07/06/18 0135)  haloperidol lactate (HALDOL) injection 5 mg (5 mg Intravenous Given 07/06/18 0204)  haloperidol lactate (HALDOL) injection 2.5 mg (2.5 mg Intravenous Given 07/06/18 0337)     PO challenged successfully in the ED I suspect this is mostly related to the cannabis use that it ongoing.  Treating for UTI.  Have advised stopping marijuana immediately.   Final Clinical Impressions(s) / ED Diagnoses   Final diagnoses:  Cannabis hyperemesis syndrome concurrent with and due to cannabis abuse (HCC)  Bladder infection    Return for pain, numbness, rashes on the skin, drainage from the wound, streaking up the arm, changes in vision or speech, fevers >100.4 unrelieved by medication, shortness of breath, intractable vomiting, or diarrhea, abdominal pain, Inability to tolerate liquids or food, cough, altered mental status or any concerns. No signs of systemic illness or infection. The patient is nontoxic-appearing on exam and vital signs are within normal limits. Will refer to urology for microscopy  hematuria as patient is asymptomatic.  I have reviewed the triage vital signs and the nursing notes. Pertinent labs &imaging results that were available during my care of the patient were reviewed by me and considered in my medical decision making (see chart for details).  After history, exam, and medical workup I feel the patient has been appropriately medically screened and is safe for discharge home. Pertinent diagnoses were discussed with the patient. Patient was given return precautions.   Jaxtyn Linville, MD 07/06/18 1610

## 2018-07-06 NOTE — Discharge Instructions (Addendum)
Stop marijuana use immediately.

## 2018-07-06 NOTE — ED Notes (Signed)
Pt given ginger ale and took a couple of sips and vomited afterwards. MD aware.

## 2018-07-07 LAB — GC/CHLAMYDIA PROBE AMP (~~LOC~~) NOT AT ARMC
CHLAMYDIA, DNA PROBE: NEGATIVE
Neisseria Gonorrhea: NEGATIVE

## 2019-07-10 IMAGING — CT CT RENAL STONE PROTOCOL
2 of 4 series · 17 of 46 positions shown, 19 images · non-contrast
Comparison: Renal ultrasound December 13, 2016 and CT abdomen and
pelvis October 18, 2015.

CLINICAL DATA: Hematuria, urinary tract infection. Nausea and
vomiting. Headache. Syncope. History of kidney stones.

EXAM:
CT ABDOMEN AND PELVIS WITHOUT CONTRAST
TECHNIQUE: Multidetector CT imaging of the abdomen and pelvis was performed
following the standard protocol without IV contrast.

[Series 3: axial st · axial · 0.61mm/px · z∈[-599,-169]mm · 14 of 96 slices shown, 16 images]
[im 5/96  soft-tissue]
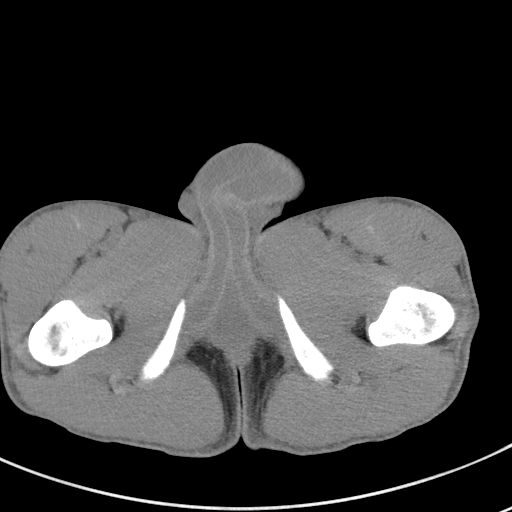
[im 5/96  bone]
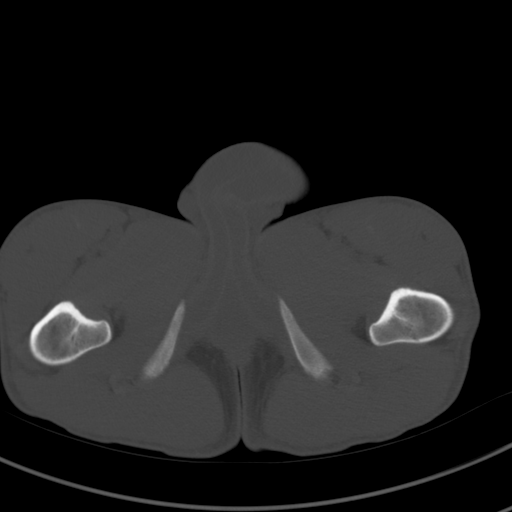
[im 13/96  soft-tissue]
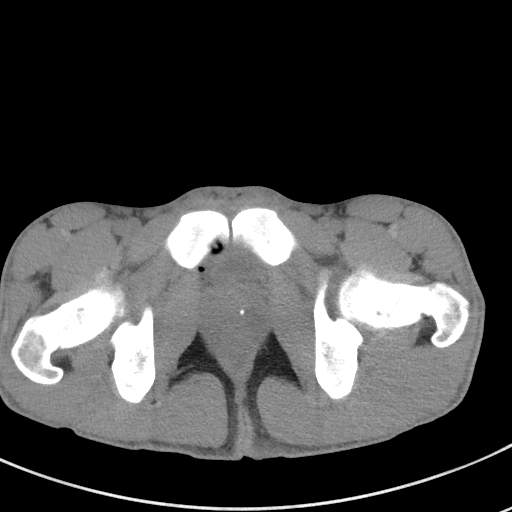
[im 17/96  soft-tissue]
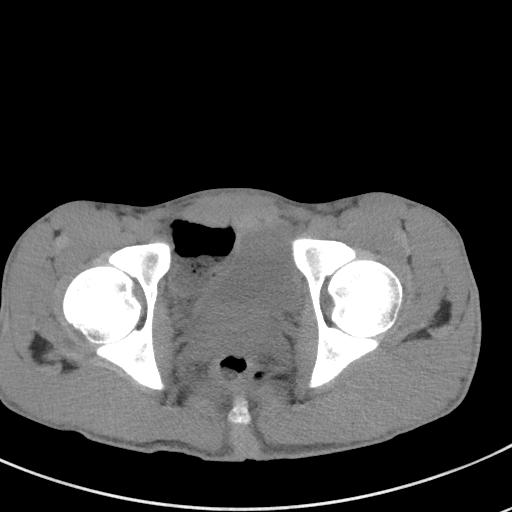
[im 25/96  soft-tissue]
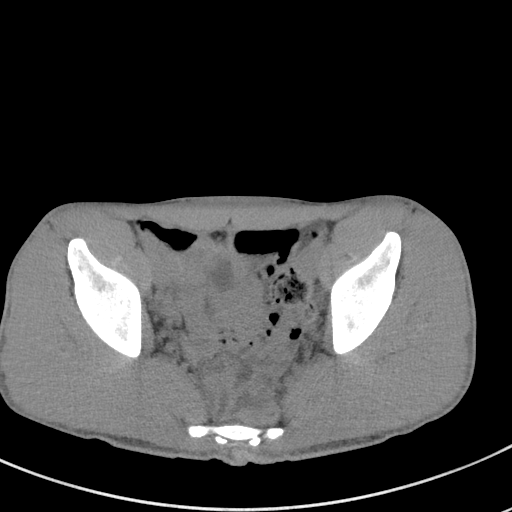
[im 34/96  soft-tissue]
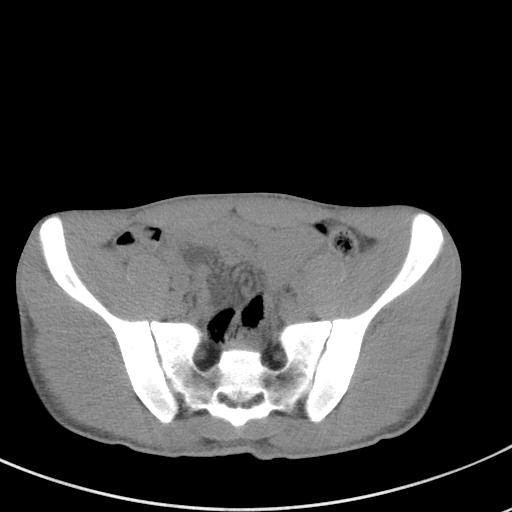
[im 38/96  soft-tissue]
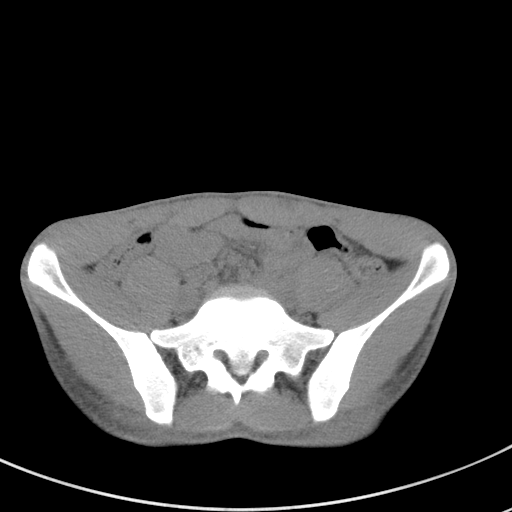
[im 46/96  soft-tissue]
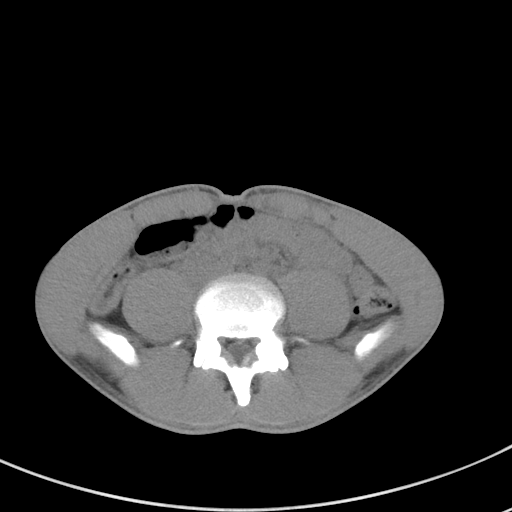
[im 50/96  soft-tissue]
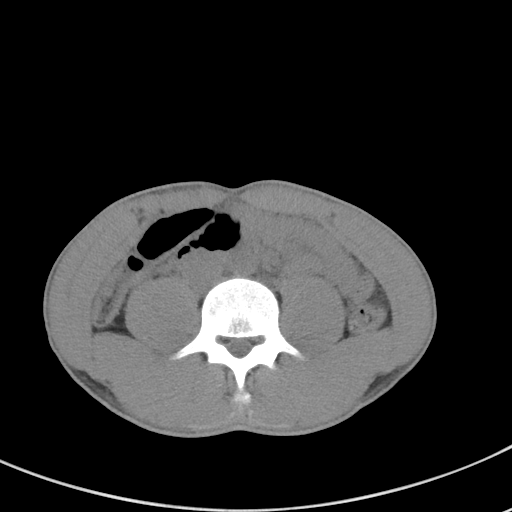
[im 58/96  soft-tissue]
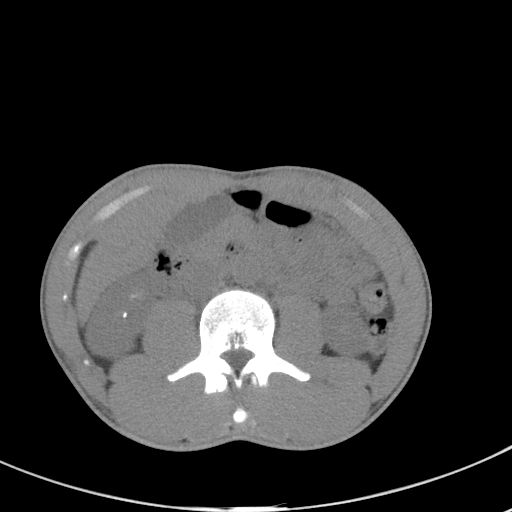
[im 58/96  bone]
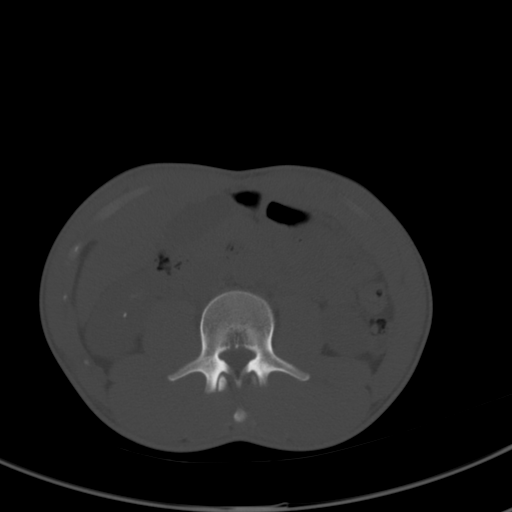
[im 62/96  soft-tissue]
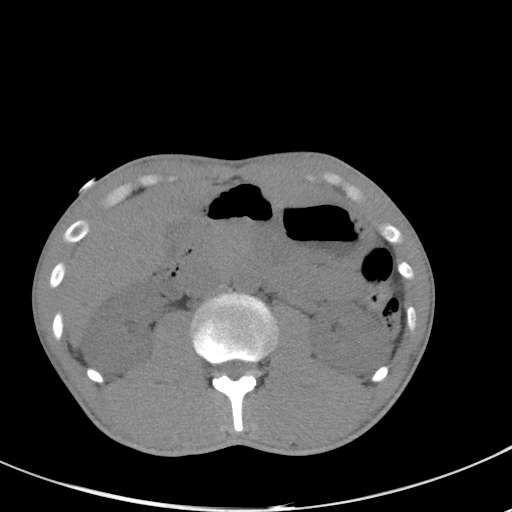
[im 71/96  soft-tissue]
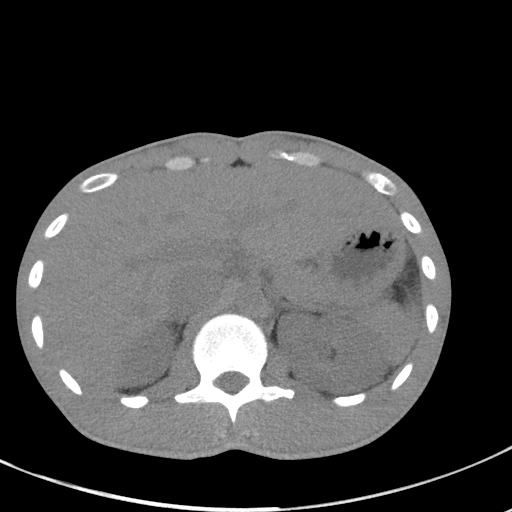
[im 79/96  soft-tissue]
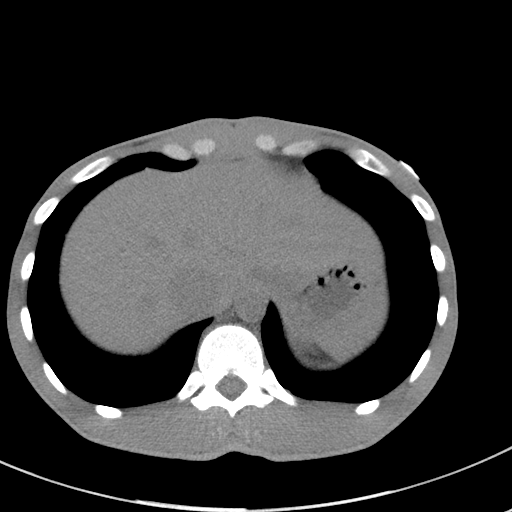
[im 83/96  soft-tissue]
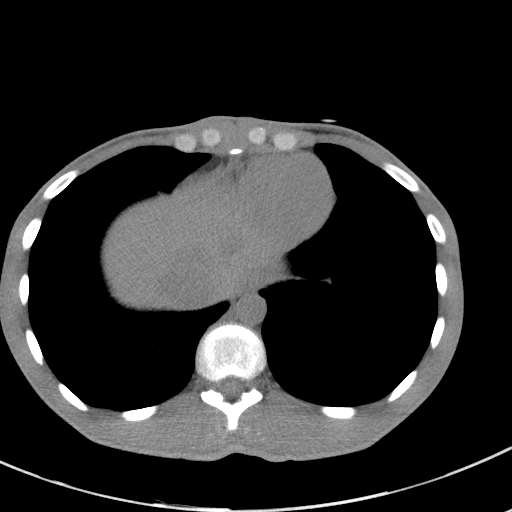
[im 91/96  soft-tissue]
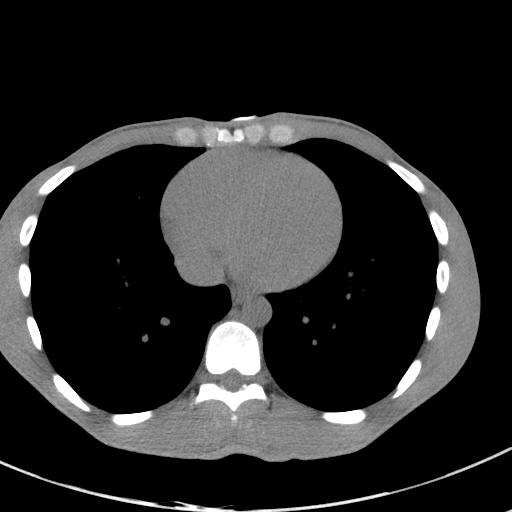

[Series 5: coronal st · coronal · 0.88mm/px · 3 of 65 slices shown]
[im 22/65  soft-tissue]
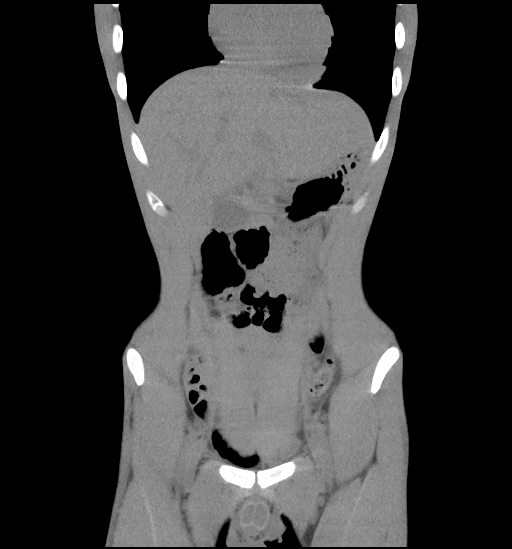
[im 29/65  soft-tissue]
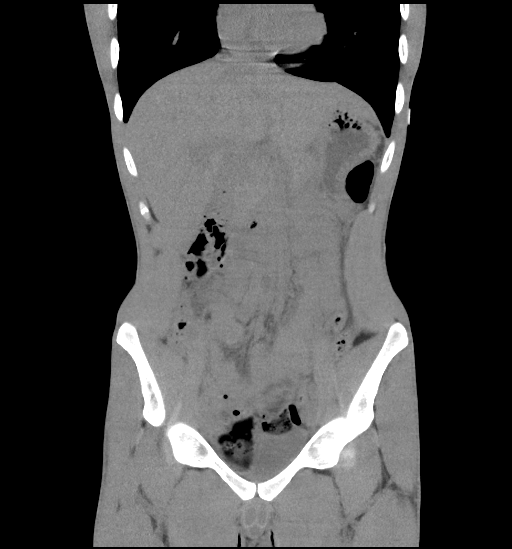
[im 36/65  soft-tissue]
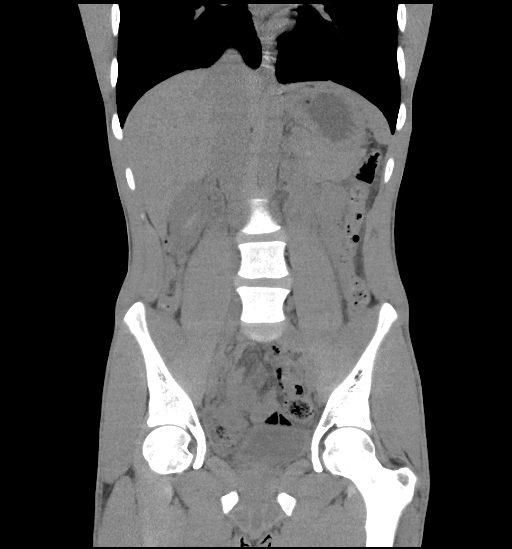

[17 of 46 positions shown; findings below may reference images not displayed]

FINDINGS: LOWER CHEST: Lung bases are clear. The visualized heart size is
normal. No pericardial effusion.

HEPATOBILIARY: Normal.

PANCREAS: Normal.

SPLEEN: Normal.

ADRENALS/URINARY TRACT: Kidneys are orthotopic, demonstrating normal
size and morphology. Increased faint calcifications of the renal
pyramids. 9 mm RIGHT upper pole nephrolithiasis. 3 mm RIGHT lower
pole nephrolithiasis. No hydronephrosis; limited assessment for
renal masses by nonenhanced CT. The unopacified ureters are normal
in course and caliber. Urinary bladder is partially distended and
unremarkable. Normal adrenal glands.

STOMACH/BOWEL: The stomach, small and large bowel are normal in
course and caliber without inflammatory changes, sensitivity
decreased by lack of enteric contrast. Normal appendix.

VASCULAR/LYMPHATIC: Aortoiliac vessels are normal in course and
caliber. No lymphadenopathy by CT size criteria.

REPRODUCTIVE: Normal.

OTHER: No intraperitoneal free fluid or free air.

MUSCULOSKELETAL: Non-acute.
IMPRESSION: 1. Nonobstructing RIGHT nephrolithiasis measuring to 9 mm.
Progressed medullary nephrocalcinosis.

## 2019-12-04 ENCOUNTER — Encounter (HOSPITAL_BASED_OUTPATIENT_CLINIC_OR_DEPARTMENT_OTHER): Payer: Self-pay

## 2019-12-04 ENCOUNTER — Emergency Department (HOSPITAL_BASED_OUTPATIENT_CLINIC_OR_DEPARTMENT_OTHER)
Admission: EM | Admit: 2019-12-04 | Discharge: 2019-12-04 | Disposition: A | Discharge status: Home / Self Care | Payer: Self-pay | Attending: Emergency Medicine | Admitting: Emergency Medicine

## 2019-12-04 ENCOUNTER — Other Ambulatory Visit: Payer: Self-pay

## 2019-12-04 DIAGNOSIS — Y92410 Unspecified street and highway as the place of occurrence of the external cause: Secondary | ICD-10-CM | POA: Insufficient documentation

## 2019-12-04 DIAGNOSIS — J45909 Unspecified asthma, uncomplicated: Secondary | ICD-10-CM | POA: Insufficient documentation

## 2019-12-04 DIAGNOSIS — Z91030 Bee allergy status: Secondary | ICD-10-CM | POA: Insufficient documentation

## 2019-12-04 DIAGNOSIS — S46811A Strain of other muscles, fascia and tendons at shoulder and upper arm level, right arm, initial encounter: Secondary | ICD-10-CM | POA: Insufficient documentation

## 2019-12-04 DIAGNOSIS — Y999 Unspecified external cause status: Secondary | ICD-10-CM | POA: Insufficient documentation

## 2019-12-04 DIAGNOSIS — F17210 Nicotine dependence, cigarettes, uncomplicated: Secondary | ICD-10-CM | POA: Insufficient documentation

## 2019-12-04 DIAGNOSIS — Z79899 Other long term (current) drug therapy: Secondary | ICD-10-CM | POA: Insufficient documentation

## 2019-12-04 DIAGNOSIS — Y9389 Activity, other specified: Secondary | ICD-10-CM | POA: Insufficient documentation

## 2019-12-04 NOTE — Discharge Instructions (Signed)
Take 4 over the counter ibuprofen tablets 3 times a day or 2 over-the-counter naproxen tablets twice a day for pain. Also take tylenol 1000mg(2 extra strength) four times a day.    
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
## 2019-12-06 ENCOUNTER — Other Ambulatory Visit: Payer: Self-pay

## 2019-12-06 ENCOUNTER — Emergency Department (HOSPITAL_COMMUNITY)
Admission: EM | Admit: 2019-12-06 | Discharge: 2019-12-06 | Disposition: A | Discharge status: Home / Self Care | Payer: Self-pay | Attending: Emergency Medicine | Admitting: Emergency Medicine

## 2019-12-06 DIAGNOSIS — M25511 Pain in right shoulder: Secondary | ICD-10-CM | POA: Insufficient documentation

## 2019-12-06 DIAGNOSIS — Z5321 Procedure and treatment not carried out due to patient leaving prior to being seen by health care provider: Secondary | ICD-10-CM | POA: Insufficient documentation
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
## 2019-12-11 ENCOUNTER — Emergency Department (HOSPITAL_BASED_OUTPATIENT_CLINIC_OR_DEPARTMENT_OTHER): Payer: No Typology Code available for payment source

## 2019-12-11 ENCOUNTER — Encounter (HOSPITAL_BASED_OUTPATIENT_CLINIC_OR_DEPARTMENT_OTHER): Payer: Self-pay

## 2019-12-11 ENCOUNTER — Emergency Department (HOSPITAL_BASED_OUTPATIENT_CLINIC_OR_DEPARTMENT_OTHER)
Admission: EM | Admit: 2019-12-11 | Discharge: 2019-12-11 | Disposition: A | Discharge status: Home / Self Care | Payer: No Typology Code available for payment source | Attending: Emergency Medicine | Admitting: Emergency Medicine

## 2019-12-11 ENCOUNTER — Other Ambulatory Visit: Payer: Self-pay

## 2019-12-11 DIAGNOSIS — Y92410 Unspecified street and highway as the place of occurrence of the external cause: Secondary | ICD-10-CM | POA: Diagnosis not present

## 2019-12-11 DIAGNOSIS — Y9389 Activity, other specified: Secondary | ICD-10-CM | POA: Diagnosis not present

## 2019-12-11 DIAGNOSIS — F17210 Nicotine dependence, cigarettes, uncomplicated: Secondary | ICD-10-CM | POA: Insufficient documentation

## 2019-12-11 DIAGNOSIS — S46911A Strain of unspecified muscle, fascia and tendon at shoulder and upper arm level, right arm, initial encounter: Secondary | ICD-10-CM | POA: Diagnosis not present

## 2019-12-11 DIAGNOSIS — Y998 Other external cause status: Secondary | ICD-10-CM | POA: Insufficient documentation

## 2019-12-11 DIAGNOSIS — F1210 Cannabis abuse, uncomplicated: Secondary | ICD-10-CM | POA: Insufficient documentation

## 2019-12-11 DIAGNOSIS — J45909 Unspecified asthma, uncomplicated: Secondary | ICD-10-CM | POA: Diagnosis not present

## 2019-12-11 DIAGNOSIS — S4991XA Unspecified injury of right shoulder and upper arm, initial encounter: Secondary | ICD-10-CM | POA: Diagnosis present

## 2019-12-11 MED ORDER — CYCLOBENZAPRINE HCL 10 MG PO TABS: 10.0000 mg | ORAL_TABLET | Freq: Two times a day (BID) | ORAL | 0 refills | Status: AC | PRN
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
## 2019-12-11 NOTE — Discharge Instructions (Signed)
You are seen today for shoulder injury.  Your x-ray was normal.  Please take the medication as prescribed.  You may wear your sling for comfort but do not wear 24 hours out of the day.  When you do not have the sling on then perform gentle range of motion exercises.  I have referred you to the sports medicine clinic where they can further evaluate you and treat you. Thank you for allowing me to care for you today. Take your medications as instructed.
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
## 2020-05-20 ENCOUNTER — Other Ambulatory Visit: Payer: Self-pay

## 2020-05-20 ENCOUNTER — Encounter (HOSPITAL_BASED_OUTPATIENT_CLINIC_OR_DEPARTMENT_OTHER): Payer: Self-pay

## 2020-05-20 ENCOUNTER — Emergency Department (HOSPITAL_BASED_OUTPATIENT_CLINIC_OR_DEPARTMENT_OTHER)
Admission: EM | Admit: 2020-05-20 | Discharge: 2020-05-20 | Disposition: A | Discharge status: Home / Self Care | Payer: No Typology Code available for payment source | Attending: Emergency Medicine | Admitting: Emergency Medicine

## 2020-05-20 ENCOUNTER — Emergency Department (HOSPITAL_BASED_OUTPATIENT_CLINIC_OR_DEPARTMENT_OTHER): Payer: No Typology Code available for payment source

## 2020-05-20 DIAGNOSIS — S70212A Abrasion, left hip, initial encounter: Secondary | ICD-10-CM | POA: Diagnosis not present

## 2020-05-20 DIAGNOSIS — S50312A Abrasion of left elbow, initial encounter: Secondary | ICD-10-CM | POA: Diagnosis not present

## 2020-05-20 DIAGNOSIS — S50811A Abrasion of right forearm, initial encounter: Secondary | ICD-10-CM | POA: Insufficient documentation

## 2020-05-20 DIAGNOSIS — Y999 Unspecified external cause status: Secondary | ICD-10-CM | POA: Insufficient documentation

## 2020-05-20 DIAGNOSIS — Y92410 Unspecified street and highway as the place of occurrence of the external cause: Secondary | ICD-10-CM | POA: Insufficient documentation

## 2020-05-20 DIAGNOSIS — S40212A Abrasion of left shoulder, initial encounter: Secondary | ICD-10-CM | POA: Diagnosis not present

## 2020-05-20 DIAGNOSIS — Z79899 Other long term (current) drug therapy: Secondary | ICD-10-CM | POA: Diagnosis not present

## 2020-05-20 DIAGNOSIS — J45909 Unspecified asthma, uncomplicated: Secondary | ICD-10-CM | POA: Diagnosis not present

## 2020-05-20 DIAGNOSIS — F17210 Nicotine dependence, cigarettes, uncomplicated: Secondary | ICD-10-CM | POA: Diagnosis not present

## 2020-05-20 DIAGNOSIS — Y9389 Activity, other specified: Secondary | ICD-10-CM | POA: Diagnosis not present

## 2020-05-20 DIAGNOSIS — Z23 Encounter for immunization: Secondary | ICD-10-CM | POA: Diagnosis not present

## 2020-05-20 DIAGNOSIS — Z91030 Bee allergy status: Secondary | ICD-10-CM | POA: Insufficient documentation

## 2020-05-20 DIAGNOSIS — T07XXXA Unspecified multiple injuries, initial encounter: Secondary | ICD-10-CM

## 2020-05-20 MED ORDER — IBUPROFEN 800 MG PO TABS
800.0000 mg | ORAL_TABLET | Freq: Once | ORAL | Status: AC
  Administered 2020-05-20: 800 mg via ORAL
  Filled 2020-05-20: qty 1

## 2020-05-20 MED ORDER — BACITRACIN ZINC 500 UNIT/GM EX OINT
TOPICAL_OINTMENT | Freq: Two times a day (BID) | CUTANEOUS | Status: DC
  Administered 2020-05-20: 1 via TOPICAL
  Filled 2020-05-20: qty 28.35

## 2020-05-20 MED ORDER — TETANUS-DIPHTH-ACELL PERTUSSIS 5-2.5-18.5 LF-MCG/0.5 IM SUSP
0.5000 mL | Freq: Once | INTRAMUSCULAR | Status: AC
  Administered 2020-05-20: 0.5 mL via INTRAMUSCULAR
  Filled 2020-05-20: qty 0.5
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
## 2020-05-20 NOTE — ED Notes (Signed)
Pt discharged to home. Discharge instructions have been discussed with patient and/or family members. Pt verbally acknowledges understanding d/c instructions, and endorses comprehension to checkout at registration before leaving.  °

## 2020-05-20 NOTE — Discharge Instructions (Signed)
Please continue to change your dressings and apply the mupirocin ointment, as directed.  It is important that you reestablish with a primary care provider for ongoing evaluation and management of your health wellbeing.  Please monitor for evidence of infection.    Return to the ED or seek immediate medical attention should you experience any new or worsening symptoms.
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
## 2020-12-16 ENCOUNTER — Other Ambulatory Visit: Payer: Self-pay

## 2020-12-16 ENCOUNTER — Emergency Department (HOSPITAL_BASED_OUTPATIENT_CLINIC_OR_DEPARTMENT_OTHER)
Admission: EM | Admit: 2020-12-16 | Discharge: 2020-12-17 | Disposition: A | Discharge status: Home / Self Care | Payer: Self-pay | Attending: Emergency Medicine | Admitting: Emergency Medicine

## 2020-12-16 ENCOUNTER — Emergency Department (HOSPITAL_BASED_OUTPATIENT_CLINIC_OR_DEPARTMENT_OTHER): Payer: Self-pay

## 2020-12-16 ENCOUNTER — Encounter (HOSPITAL_BASED_OUTPATIENT_CLINIC_OR_DEPARTMENT_OTHER): Payer: Self-pay | Admitting: *Deleted

## 2020-12-16 DIAGNOSIS — E860 Dehydration: Secondary | ICD-10-CM

## 2020-12-16 DIAGNOSIS — R109 Unspecified abdominal pain: Secondary | ICD-10-CM | POA: Insufficient documentation

## 2020-12-16 DIAGNOSIS — F17210 Nicotine dependence, cigarettes, uncomplicated: Secondary | ICD-10-CM | POA: Insufficient documentation

## 2020-12-16 DIAGNOSIS — J45909 Unspecified asthma, uncomplicated: Secondary | ICD-10-CM | POA: Insufficient documentation

## 2020-12-16 DIAGNOSIS — N200 Calculus of kidney: Secondary | ICD-10-CM | POA: Insufficient documentation

## 2020-12-16 DIAGNOSIS — R112 Nausea with vomiting, unspecified: Secondary | ICD-10-CM

## 2020-12-16 LAB — COMPREHENSIVE METABOLIC PANEL
ALT: 17 U/L (ref 0–44)
AST: 25 U/L (ref 15–41)
Albumin: 5.1 g/dL — ABNORMAL HIGH (ref 3.5–5.0)
Alkaline Phosphatase: 69 U/L (ref 38–126)
Anion gap: 14 (ref 5–15)
BUN: 17 mg/dL (ref 6–20)
CO2: 24 mmol/L (ref 22–32)
Calcium: 9.6 mg/dL (ref 8.9–10.3)
Chloride: 102 mmol/L (ref 98–111)
Creatinine, Ser: 1.06 mg/dL (ref 0.61–1.24)
GFR, Estimated: >60 mL/min (ref >60)
Glucose, Bld: 134 mg/dL — ABNORMAL HIGH (ref 70–99)
Potassium: 3.5 mmol/L (ref 3.5–5.1)
Sodium: 140 mmol/L (ref 135–145)
Total Bilirubin: 0.9 mg/dL (ref 0.3–1.2)
Total Protein: 8.1 g/dL (ref 6.5–8.1)

## 2020-12-16 LAB — CBC WITH DIFFERENTIAL/PLATELET
Abs Immature Granulocytes: 0.04 10*3/uL (ref 0.00–0.07)
Basophils Absolute: 0 10*3/uL (ref 0.0–0.1)
Basophils Relative: 0 %
Eosinophils Absolute: 0 10*3/uL (ref 0.0–0.5)
Eosinophils Relative: 0 %
HCT: 39.4 % (ref 39.0–52.0)
Hemoglobin: 13.3 g/dL (ref 13.0–17.0)
Immature Granulocytes: 0 %
Lymphocytes Relative: 16 %
Lymphs Abs: 1.5 10*3/uL (ref 0.7–4.0)
MCH: 30.4 pg (ref 26.0–34.0)
MCHC: 33.8 g/dL (ref 30.0–36.0)
MCV: 90.2 fL (ref 80.0–100.0)
Monocytes Absolute: 0.4 10*3/uL (ref 0.1–1.0)
Monocytes Relative: 4 %
Neutro Abs: 7.5 10*3/uL (ref 1.7–7.7)
Neutrophils Relative %: 80 %
Platelets: 241 10*3/uL (ref 150–400)
RBC: 4.37 MIL/uL (ref 4.22–5.81)
RDW: 12 % (ref 11.5–15.5)
WBC: 9.4 10*3/uL (ref 4.0–10.5)
nRBC: 0 % (ref 0.0–0.2)

## 2020-12-16 LAB — LIPASE, BLOOD: Lipase: 23 U/L (ref 11–51)

## 2020-12-16 MED ORDER — MORPHINE SULFATE (PF) 4 MG/ML IV SOLN
4.0000 mg | Freq: Once | INTRAVENOUS | Status: AC
  Administered 2020-12-16: 4 mg via INTRAVENOUS
  Filled 2020-12-16: qty 1

## 2020-12-16 MED ORDER — ONDANSETRON HCL 4 MG/2ML IJ SOLN
4.0000 mg | Freq: Once | INTRAMUSCULAR | Status: AC
  Administered 2020-12-16: 4 mg via INTRAVENOUS
  Filled 2020-12-16: qty 2

## 2020-12-16 MED ORDER — SODIUM CHLORIDE 0.9 % IV BOLUS (SEPSIS)
1000.0000 mL | Freq: Once | INTRAVENOUS | Status: AC
  Administered 2020-12-16: 1000 mL via INTRAVENOUS

## 2020-12-16 NOTE — ED Notes (Signed)
Patient transported to CT 

## 2020-12-16 NOTE — ED Triage Notes (Signed)
C/o right flank pain with n/v/ x 2 days

## 2020-12-16 NOTE — ED Provider Notes (Signed)
TIME SEEN: 11:34 PM  CHIEF COMPLAINT: Right flank pain  HPI: Patient with history of kidney stones, cannabinoid hyperemesis syndrome who presents to the emergency department with sharp, severe right flank pain, nausea and vomiting for the past 2 days.  Also reports constipation and has a hard time pain.  No dysuria or hematuria.  No penile discharge.  States he has had tactile fevers.  Has had to have procedures in the past for his kidney stones.  ROS: See HPI Constitutional: no fever  Eyes: no drainage  ENT: no runny nose   Cardiovascular:  no chest pain  Resp: no SOB  GI:  vomiting GU: no dysuria Integumentary: no rash  Allergy: no hives  Musculoskeletal: no leg swelling  Neurological: no slurred speech ROS otherwise negative  PAST MEDICAL HISTORY/PAST SURGICAL HISTORY:  Past Medical History:  Diagnosis Date  . 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 Asthma   . Kidney stones     MEDICATIONS:  Prior to Admission medications   Medication Sig Start Date End Date Taking? Authorizing Provider  albuterol (PROVENTIL HFA;VENTOLIN HFA) 108 (90 BASE) MCG/ACT inhaler Inhale 2 puffs into the lungs every 6 (six) hours as needed for wheezing or shortness of breath. For shortness of breath and wheezing    [provider]  cephALEXin (KEFLEX) 500 MG capsule Take 1 capsule (500 mg total) by mouth 4 (four) times daily. 07/06/18   Palumbo, April, MD  EPINEPHrine (EPI-PEN) 0.3 mg/0.3 mL DEVI Inject 0.3 mg into the muscle daily as needed (Anaphylaxis). 11/01/12   Teressa Lower, NP  Fluticasone-Salmeterol (ADVAIR) 250-50 MCG/DOSE AEPB Inhale 1 puff into the lungs every 12 (twelve) hours as needed (Asthma).     [provider]  naproxen (NAPROSYN) 250 MG tablet Take 1 tablet (250 mg total) by mouth 2 (two) times daily with a meal. 10/14/16   Everlene Farrier, PA-C  ondansetron (ZOFRAN ODT) 8 MG disintegrating tablet 8mg  ODT q8 hours prn  nausea 07/06/18   Palumbo, April, MD    ALLERGIES:  Allergies  Allergen Reactions  . Bee Venom Shortness Of Breath    Yellow Jacket    SOCIAL HISTORY:  Social History   Tobacco Use  . Smoking status: Current Every Day Smoker    Packs/day: 0.20    Years: 6.00    Pack years: 1.20    Types: Cigarettes  . Smokeless tobacco: Never Used  Substance Use Topics  . Alcohol use: No    FAMILY HISTORY: No family history on file.  EXAM: BP 107/68   Pulse 65   Temp 98.3 F (36.8 C) (Oral)   Resp 18   Ht 6' (1.829 m)   Wt 56.7 kg   SpO2 98%   BMI 16.95 kg/m  CONSTITUTIONAL: Alert and oriented and responds appropriately to questions.  Appears uncomfortable HEAD: Normocephalic EYES: Conjunctivae clear, pupils appear equal, EOM appear intact ENT: normal nose; moist mucous membranes NECK: Supple, normal ROM CARD: RRR; S1 and S2 appreciated; no murmurs, no clicks, no rubs, no gallops RESP: Normal chest excursion without splinting or tachypnea; breath sounds clear and equal bilaterally; no wheezes, no rhonchi, no rales, no hypoxia or respiratory distress, speaking full sentences ABD/GI: Normal bowel sounds; non-distended; soft, non-tender, no rebound, no guarding, no peritoneal signs, no hepatosplenomegaly, no tenderness at McBurney's point BACK:  The back appears normal EXT: Normal ROM in all joints; no deformity noted, no edema; no cyanosis SKIN: Normal color  for age and race; warm; no rash on exposed skin NEURO: Moves all extremities equally PSYCH: The patient's mood and manner are appropriate.   MEDICAL DECISION MAKING: Patient here with tactile fevers, difficulty urinating, nausea vomiting, flank pain.  States this feels like his previous kidney stones.  Reports having had 6 previous procedures for kidney stones.  Will obtain labs, urine, CT scan.  Will treat symptoms with IV fluid, morphine, Zofran.  ED PROGRESS: 12:45 AM  Patient CT scan shows no acute abnormality.  Able to  tolerate p.o. here.  Labs reassuring.  Urine pending.  Reports feeling better.  1:05 AM  Pt's urine shows no sign of infection.  He does have large ketones.  Will give second bag of IV fluids prior to discharge home.  Drug screen is positive for THC.  Symptoms could be related to hyperemesis cannabinoid syndrome.  Tolerating p.o. here without further vomiting.  Anticipate discharge home.  At this time, I do not feel there is any life-threatening condition present. I have reviewed, interpreted and discussed all results (EKG, imaging, lab, urine as appropriate) and exam findings with patient/family. I have reviewed nursing notes and appropriate previous records.  I feel the patient is safe to be discharged home without further emergent workup and can continue workup as an outpatient as needed. Discussed usual and customary return precautions. Patient/family verbalize understanding and are comfortable with this plan.  Outpatient follow-up has been provided as needed. All questions have been answered.   Nathan Tanner was evaluated in Emergency Department on 12/16/2020 for the symptoms described in the history of present illness. He was evaluated in the context of the global COVID-19 pandemic, which necessitated consideration that the patient might be at risk for infection with the SARS-CoV-2 virus that causes COVID-19. Institutional protocols and algorithms that pertain to the evaluation of patients at risk for COVID-19 are in a state of rapid change based on information released by regulatory bodies including the CDC and federal and state organizations. These policies and algorithms were followed during the patient's care in the ED.      Montserrat Shek, Layla Maw, DO 12/17/20 0107

## 2020-12-17 LAB — RAPID URINE DRUG SCREEN, HOSP PERFORMED
Amphetamines: NOT DETECTED
Barbiturates: NOT DETECTED
Benzodiazepines: NOT DETECTED
Cocaine: NOT DETECTED
Opiates: POSITIVE — AB
Tetrahydrocannabinol: POSITIVE — AB

## 2020-12-17 LAB — URINALYSIS, MICROSCOPIC (REFLEX)

## 2020-12-17 LAB — URINALYSIS, ROUTINE W REFLEX MICROSCOPIC
Bilirubin Urine: NEGATIVE
Glucose, UA: NEGATIVE mg/dL
Ketones, ur: 80 mg/dL — AB
Leukocytes,Ua: NEGATIVE
Nitrite: NEGATIVE
Protein, ur: 30 mg/dL — AB
Specific Gravity, Urine: 1.025 (ref 1.005–1.030)
pH: 6.5 (ref 5.0–8.0)

## 2020-12-17 MED ORDER — DICYCLOMINE HCL 20 MG PO TABS: 20.0000 mg | ORAL_TABLET | Freq: Three times a day (TID) | ORAL | 0 refills | Status: DC | PRN

## 2020-12-17 MED ORDER — SODIUM CHLORIDE 0.9 % IV BOLUS (SEPSIS)
1000.0000 mL | Freq: Once | INTRAVENOUS | Status: AC
  Administered 2020-12-17: 01:00:00 1000 mL via INTRAVENOUS

## 2020-12-17 MED ORDER — ONDANSETRON 4 MG PO TBDP: 4.0000 mg | ORAL_TABLET | Freq: Four times a day (QID) | ORAL | 0 refills | Status: DC | PRN

## 2020-12-17 NOTE — Discharge Instructions (Addendum)
You may alternate Tylenol 1000 mg every 6 hours as needed for pain, fever and Ibuprofen 800 mg every 8 hours as needed for pain, fever.  Please take Ibuprofen with food.  Do not take more than 4000 mg of Tylenol (acetaminophen) in a 24 hour period.  Your labs, urine today were reassuring other than showing signs of dehydration.  Your CT scan showed stones in your kidneys but no stones leaving your kidneys which are the type that cause pain.   I recommend bland diet for the next several days.  I recommend increasing your fluid intake.

## 2022-03-25 ENCOUNTER — Emergency Department (HOSPITAL_BASED_OUTPATIENT_CLINIC_OR_DEPARTMENT_OTHER)
Admission: EM | Admit: 2022-03-25 | Discharge: 2022-03-25 | Disposition: A | Discharge status: Home / Self Care | Payer: Self-pay | Attending: Emergency Medicine | Admitting: Emergency Medicine

## 2022-03-25 ENCOUNTER — Other Ambulatory Visit: Payer: Self-pay

## 2022-03-25 ENCOUNTER — Encounter (HOSPITAL_BASED_OUTPATIENT_CLINIC_OR_DEPARTMENT_OTHER): Payer: Self-pay

## 2022-03-25 DIAGNOSIS — R112 Nausea with vomiting, unspecified: Secondary | ICD-10-CM | POA: Insufficient documentation

## 2022-03-25 DIAGNOSIS — R1084 Generalized abdominal pain: Secondary | ICD-10-CM | POA: Insufficient documentation

## 2022-03-25 DIAGNOSIS — R197 Diarrhea, unspecified: Secondary | ICD-10-CM | POA: Insufficient documentation

## 2022-03-25 LAB — CBC WITH DIFFERENTIAL/PLATELET
Abs Immature Granulocytes: 0.03 10*3/uL (ref 0.00–0.07)
Basophils Absolute: 0 10*3/uL (ref 0.0–0.1)
Basophils Relative: 0 %
Eosinophils Absolute: 0 10*3/uL (ref 0.0–0.5)
Eosinophils Relative: 0 %
HCT: 41.9 % (ref 39.0–52.0)
Hemoglobin: 13.9 g/dL (ref 13.0–17.0)
Immature Granulocytes: 0 %
Lymphocytes Relative: 9 %
Lymphs Abs: 0.8 10*3/uL (ref 0.7–4.0)
MCH: 30.1 pg (ref 26.0–34.0)
MCHC: 33.2 g/dL (ref 30.0–36.0)
MCV: 90.7 fL (ref 80.0–100.0)
Monocytes Absolute: 0.3 10*3/uL (ref 0.1–1.0)
Monocytes Relative: 4 %
Neutro Abs: 7.8 10*3/uL — ABNORMAL HIGH (ref 1.7–7.7)
Neutrophils Relative %: 87 %
Platelets: 248 10*3/uL (ref 150–400)
RBC: 4.62 MIL/uL (ref 4.22–5.81)
RDW: 12.3 % (ref 11.5–15.5)
WBC: 9 10*3/uL (ref 4.0–10.5)
nRBC: 0 % (ref 0.0–0.2)

## 2022-03-25 LAB — COMPREHENSIVE METABOLIC PANEL
ALT: 18 U/L (ref 0–44)
AST: 25 U/L (ref 15–41)
Albumin: 4.7 g/dL (ref 3.5–5.0)
Alkaline Phosphatase: 86 U/L (ref 38–126)
Anion gap: 11 (ref 5–15)
BUN: 14 mg/dL (ref 6–20)
CO2: 25 mmol/L (ref 22–32)
Calcium: 9.3 mg/dL (ref 8.9–10.3)
Chloride: 103 mmol/L (ref 98–111)
Creatinine, Ser: 1.03 mg/dL (ref 0.61–1.24)
GFR, Estimated: >60 mL/min (ref >60)
Glucose, Bld: 135 mg/dL — ABNORMAL HIGH (ref 70–99)
Potassium: 3.8 mmol/L (ref 3.5–5.1)
Sodium: 139 mmol/L (ref 135–145)
Total Bilirubin: 0.8 mg/dL (ref 0.3–1.2)
Total Protein: 8.2 g/dL — ABNORMAL HIGH (ref 6.5–8.1)

## 2022-03-25 LAB — LIPASE, BLOOD: Lipase: 26 U/L (ref 11–51)

## 2022-03-25 MED ORDER — DICYCLOMINE HCL 20 MG PO TABS: 20.0000 mg | ORAL_TABLET | Freq: Three times a day (TID) | ORAL | 0 refills | Status: AC | PRN

## 2022-03-25 MED ORDER — ONDANSETRON 4 MG PO TBDP: 4.0000 mg | ORAL_TABLET | Freq: Three times a day (TID) | ORAL | 0 refills | Status: AC | PRN

## 2022-03-25 MED ORDER — HYDROMORPHONE HCL 1 MG/ML IJ SOLN
1.0000 mg | Freq: Once | INTRAMUSCULAR | Status: AC
  Administered 2022-03-25: 1 mg via INTRAVENOUS
  Filled 2022-03-25: qty 1

## 2022-03-25 MED ORDER — ONDANSETRON HCL 4 MG/2ML IJ SOLN: 4.0000 mg | Freq: Once | INTRAMUSCULAR | Status: DC

## 2022-03-25 MED ORDER — SODIUM CHLORIDE 0.9 % IV BOLUS
500.0000 mL | Freq: Once | INTRAVENOUS | Status: AC
  Administered 2022-03-25: 500 mL via INTRAVENOUS

## 2022-03-25 MED ORDER — DROPERIDOL 2.5 MG/ML IJ SOLN
1.2500 mg | Freq: Once | INTRAMUSCULAR | Status: AC
  Administered 2022-03-25: 1.25 mg via INTRAVENOUS
  Filled 2022-03-25: qty 2

## 2022-03-25 NOTE — ED Notes (Signed)
ED Provider at bedside. 

## 2022-03-25 NOTE — ED Triage Notes (Signed)
C/o vomiting/diarrhea since yesterday. Unable to tolerate PO. C/o right flank pain. ?

## 2022-03-25 NOTE — ED Provider Notes (Signed)
?[facility] EMERGENCY DEPARTMENT ?Provider Note ? ? ?CSN: [id number] ?Arrival date & time: 03/25/22  1351 ? ?  ? ?History ? ?Chief Complaint  ?Patient presents with  ? Vomiting  ? ? ?Nathan Tanner is a 31 y.o. male present emergency department nausea, vomiting, diarrhea.  Onset yesterday, suddenly, initially with nausea and vomiting, subsequently right-sided flank pain.  Is also had loose bowel movements all night.  He cannot keep anything down.  Pain is 10 out of 10.  He says it feels different than his prior episodes for which she was seen in the ER, by review of the medical records, again with nausea vomiting and right flank pain.  He has a difficult time explaining why it is different.  He is here with his son at bedside. ? ?On his prior CT scan on his last ED visit he was noted to have a right-sided renal stone. ? ?HPI ? ?  ? ?Home Medications ?Prior to Admission medications   ?Medication Sig Start Date End Date Taking? Authorizing Provider  ?albuterol (PROVENTIL HFA;VENTOLIN HFA) 108 (90 BASE) MCG/ACT inhaler Inhale 2 puffs into the lungs every 6 (six) hours as needed for wheezing or shortness of breath. For shortness of breath and wheezing    [provider]  ?dicyclomine (BENTYL) 20 MG tablet Take 1 tablet (20 mg total) by mouth every 8 (eight) hours as needed for spasms (Abdominal cramping). 12/17/20   Ward, Layla Maw, DO  ?EPINEPHrine (EPI-PEN) 0.3 mg/0.3 mL DEVI Inject 0.3 mg into the muscle daily as needed (Anaphylaxis). 11/01/12   Teressa Lower, NP  ?Fluticasone-Salmeterol (ADVAIR) 250-50 MCG/DOSE AEPB Inhale 1 puff into the lungs every 12 (twelve) hours as needed (Asthma).     [provider]  ?ondansetron (ZOFRAN ODT) 4 MG disintegrating tablet Take 1 tablet (4 mg total) by mouth every 6 (six) hours as needed for nausea or vomiting. 12/17/20   Ward, Layla Maw, DO  ?   ? ?Allergies    ?Bee venom   ? ?Review of Systems   ?Review of Systems ? ?Physical Exam ?Updated Vital  Signs ?BP (!) 98/55   Pulse 63   Temp 98.1 ?F (36.7 ?C) (Oral)   Resp 18   Ht 6' (1.829 m)   Wt 63.5 kg   SpO2 97%   BMI 18.99 kg/m?  ?Physical Exam ?Constitutional:   ?   General: He is not in acute distress. ?HENT:  ?   Head: Normocephalic and atraumatic.  ?Eyes:  ?   Conjunctiva/sclera: Conjunctivae normal.  ?   Pupils: Pupils are equal, round, and reactive to light.  ?Cardiovascular:  ?   Rate and Rhythm: Normal rate and regular rhythm.  ?Pulmonary:  ?   Effort: Pulmonary effort is normal. No respiratory distress.  ?Abdominal:  ?   Tenderness: There is generalized abdominal tenderness.  ?Skin: ?   General: Skin is warm and dry.  ?Neurological:  ?   General: No focal deficit present.  ?   Mental Status: He is alert. Mental status is at baseline.  ?Psychiatric:     ?   Mood and Affect: Mood normal.     ?   Behavior: Behavior normal.  ? ? ?ED Results / Procedures / Treatments   ?Labs ?(all labs ordered are listed, but only abnormal results are displayed) ?Labs Reviewed  ?COMPREHENSIVE METABOLIC PANEL - Abnormal; Notable for the following components:  ?    Result Value  ? Glucose, Bld 135 (*)   ? Total  Protein 8.2 (*)   ? All other components within normal limits  ?CBC WITH DIFFERENTIAL/PLATELET - Abnormal; Notable for the following components:  ? Neutro Abs 7.8 (*)   ? All other components within normal limits  ?LIPASE, BLOOD  ?URINALYSIS, ROUTINE W REFLEX MICROSCOPIC  ? ? ?EKG ?None ? ?Radiology ?No results found. ? ?Procedures ?Procedures  ? ? ?Medications Ordered in ED ?Medications  ?HYDROmorphone (DILAUDID) injection 1 mg (1 mg Intravenous Given 03/25/22 1425)  ?sodium chloride 0.9 % bolus 500 mL (500 mLs Intravenous New Bag/Given 03/25/22 1420)  ?droperidol (INAPSINE) 2.5 MG/ML injection 1.25 mg (1.25 mg Intravenous Given 03/25/22 1423)  ? ? ?ED Course/ Medical Decision Making/ A&P ?Clinical Course as of 03/25/22 1515  ?Thu Mar 25, 2022  ?1512 Pt signed out to Dr Jacqulyn Bath EDP pending f/u on UA and reassessment  of symptoms after meds [MT]  ?  ?Clinical Course User Index ?[MT] Terald Sleeper, MD  ? ?                        ?Medical Decision Making ?Amount and/or Complexity of Data Reviewed ?Labs: ordered. ? ?Risk ?Prescription drug management. ? ? ?Abdominal pain, differential diagnosis to include peptic disease versus biliary disease versus gastroparesis versus viral gastroenteritis versus ureteral colic versus other. ? ?Unfortunately  he has a difficult abdominal exam is he complains he is having severe pain and has voluntary guarding diffusely.  His GI symptoms including diarrhea with lower my suspicion for acute biliary disease, we will check the remainder of his labs, give him droperidol for suspected gastroparesis as well as some Dilaudid to see appears to be in significant distress.  Cannot exclude the possibility of ureteral stone as well but he does not want a CT scan at this time. ? ? ? ? ? ? ? ?Final Clinical Impression(s) / ED Diagnoses ?Final diagnoses:  ?None  ? ? ?Rx / DC Orders ?ED Discharge Orders   ? ? None  ? ?  ? ? ?  ?Terald Sleeper, MD ?03/25/22 1515 ? ?

## 2022-03-25 NOTE — Discharge Instructions (Signed)

## 2022-03-25 NOTE — ED Provider Notes (Signed)
Blood pressure (!) 98/55, pulse 63, temperature 98.1 ?F (36.7 ?C), temperature source Oral, resp. rate 18, height 6' (1.829 m), weight 63.5 kg, SpO2 97 %. ? ?Assuming care from Dr. Renaye Rakers.  In short, Nathan Tanner is a 31 y.o. male with a chief complaint of Vomiting ?Marland Kitchen  Refer to the original H&P for additional details. ? ?The current Tanner of care is to follow up after medication. ? ?03:55 PM ?Patient reevaluated and feeling much better.  No additional vomiting.  No leukocytosis, anemia, acute kidney injury, electrolyte disturbance. Lipase normal. Abdominal exam is reassuring. Agree with deferring CT imaging. Tanner for discharge. Patient has a ride home.  ? ?  ?Nathan Plan, MD ?03/25/22 1559 ? ?
